# Patient Record
Sex: Male | Born: 1940 | Race: White | Hispanic: No | Marital: Married | Smoking: Never smoker
Health system: Southern US, Community
[De-identification: ages and names within clinical notes are randomized; demographics above are authoritative.]

## PROBLEM LIST (undated history)

## (undated) DIAGNOSIS — E785 Hyperlipidemia, unspecified: Secondary | ICD-10-CM

## (undated) DIAGNOSIS — R0683 Snoring: Secondary | ICD-10-CM

## (undated) DIAGNOSIS — H409 Unspecified glaucoma: Secondary | ICD-10-CM

## (undated) DIAGNOSIS — E669 Obesity, unspecified: Secondary | ICD-10-CM

## (undated) DIAGNOSIS — I10 Essential (primary) hypertension: Secondary | ICD-10-CM

## (undated) DIAGNOSIS — M1990 Unspecified osteoarthritis, unspecified site: Secondary | ICD-10-CM

## (undated) DIAGNOSIS — K635 Polyp of colon: Secondary | ICD-10-CM

## (undated) DIAGNOSIS — Z800 Family history of malignant neoplasm of digestive organs: Secondary | ICD-10-CM

## (undated) DIAGNOSIS — G4730 Sleep apnea, unspecified: Secondary | ICD-10-CM

## (undated) HISTORY — DX: Sleep apnea, unspecified: G47.30

## (undated) HISTORY — PX: EYE SURGERY: SHX253

## (undated) HISTORY — DX: Hyperlipidemia, unspecified: E78.5

## (undated) HISTORY — PX: HX COLONOSCOPY: 2100001147

## (undated) HISTORY — PX: HX EYE SURGERY: 2100001143

---
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
## 1962-01-18 HISTORY — PX: HEMORROIDECTOMY: SUR656

## 2003-01-19 HISTORY — PX: KNEE CARTILAGE SURGERY: SHX688

## 2005-09-22 ENCOUNTER — Ambulatory Visit: Payer: Self-pay

## 2007-01-06 ENCOUNTER — Ambulatory Visit: Payer: Self-pay

## 2009-01-18 HISTORY — PX: HX KNEE SURGERY: 2100001320

## 2009-05-16 ENCOUNTER — Other Ambulatory Visit: Payer: Self-pay

## 2009-05-16 ENCOUNTER — Ambulatory Visit: Admission: RE | Admit: 2009-05-16 | Payer: Self-pay | Source: Ambulatory Visit

## 2009-05-20 LAB — HISTORICAL SURGICAL PATHOLOGY SPECIMEN

## 2011-01-19 HISTORY — PX: HX EYE SURGERY: 2100001143

## 2014-09-26 ENCOUNTER — Other Ambulatory Visit (HOSPITAL_BASED_OUTPATIENT_CLINIC_OR_DEPARTMENT_OTHER): Payer: Self-pay | Admitting: GASTROENTEROLOGY

## 2014-09-26 ENCOUNTER — Encounter (HOSPITAL_BASED_OUTPATIENT_CLINIC_OR_DEPARTMENT_OTHER): Payer: Self-pay

## 2014-09-26 ENCOUNTER — Ambulatory Visit (HOSPITAL_BASED_OUTPATIENT_CLINIC_OR_DEPARTMENT_OTHER)
Admission: RE | Admit: 2014-09-26 | Discharge: 2014-09-26 | Disposition: A | Discharge status: Home / Self Care | Payer: Medicare PPO | Source: Ambulatory Visit | Attending: GASTROENTEROLOGY | Admitting: GASTROENTEROLOGY

## 2014-09-26 DIAGNOSIS — M1990 Unspecified osteoarthritis, unspecified site: Secondary | ICD-10-CM | POA: Insufficient documentation

## 2014-09-26 DIAGNOSIS — E669 Obesity, unspecified: Secondary | ICD-10-CM | POA: Insufficient documentation

## 2014-09-26 DIAGNOSIS — H409 Unspecified glaucoma: Secondary | ICD-10-CM | POA: Insufficient documentation

## 2014-09-26 DIAGNOSIS — I10 Essential (primary) hypertension: Secondary | ICD-10-CM | POA: Insufficient documentation

## 2014-09-26 DIAGNOSIS — Z01818 Encounter for other preprocedural examination: Secondary | ICD-10-CM | POA: Insufficient documentation

## 2014-09-26 DIAGNOSIS — E785 Hyperlipidemia, unspecified: Secondary | ICD-10-CM | POA: Insufficient documentation

## 2014-09-26 DIAGNOSIS — Z800 Family history of malignant neoplasm of digestive organs: Secondary | ICD-10-CM | POA: Insufficient documentation

## 2014-09-26 DIAGNOSIS — K635 Polyp of colon: Secondary | ICD-10-CM | POA: Insufficient documentation

## 2014-09-26 HISTORY — DX: Unspecified osteoarthritis, unspecified site: M19.90

## 2014-09-26 HISTORY — DX: Family history of malignant neoplasm of digestive organs: Z80.0

## 2014-09-26 HISTORY — DX: Unspecified glaucoma: H40.9

## 2014-09-26 HISTORY — DX: Snoring: R06.83

## 2014-09-26 HISTORY — DX: Polyp of colon: K63.5

## 2014-09-26 HISTORY — DX: Hyperlipidemia, unspecified: E78.5

## 2014-09-26 HISTORY — DX: Essential (primary) hypertension: I10

## 2014-09-26 HISTORY — DX: Obesity, unspecified: E66.9

## 2014-09-26 LAB — CBC WITH DIFF
BASOPHIL #: 0.1 x10ˆ3/uL (ref 0.00–0.10)
BASOPHIL %: 1 % (ref 0–3)
EOSINOPHIL #: 0.1 x10ˆ3/uL (ref 0.00–0.50)
EOSINOPHIL %: 2 % (ref 0–5)
HCT: 45.7 % (ref 40.0–50.0)
HGB: 15.3 g/dL (ref 13.5–18.0)
LYMPHOCYTE #: 1.7 x10ˆ3/uL (ref 1.00–4.80)
LYMPHOCYTE %: 27 % (ref 15–43)
MCH: 31.6 pg (ref 27.5–33.2)
MCHC: 33.6 g/dL (ref 32.0–36.0)
MCV: 94.2 fL (ref 82.0–97.0)
MONOCYTE #: 0.6 x10ˆ3/uL (ref 0.20–0.90)
MONOCYTE %: 10 % (ref 5–12)
MPV: 8.2 fL (ref 7.4–10.5)
NEUTROPHIL #: 3.8 x10ˆ3/uL (ref 1.50–6.50)
NEUTROPHIL %: 60 % (ref 43–76)
PLATELETS: 238 x10ˆ3/uL (ref 150–450)
RBC: 4.85 x10ˆ6/uL (ref 4.40–5.80)
RDW: 13.7 % (ref 11.0–16.0)
WBC: 6.3 x10ˆ3/uL (ref 4.0–11.0)

## 2014-09-26 LAB — PT/INR
INR: 0.97
PROTHROMBIN TIME: 10.7 s (ref 9.4–12.5)

## 2014-09-27 LAB — ECG 12-LEAD
Atrial Rate: 62 {beats}/min
Calculated P Axis: 74 degrees
Calculated R Axis: 75 degrees
Calculated T Axis: 72 degrees
PR Interval: 142 ms
QRS Duration: 88 ms
QT Interval: 432 ms
QTC Calculation: 438 ms
Ventricular rate: 62 {beats}/min
Ventricular rate: 62 {beats}/min

## 2014-09-30 ENCOUNTER — Ambulatory Visit (HOSPITAL_BASED_OUTPATIENT_CLINIC_OR_DEPARTMENT_OTHER): Payer: Commercial Managed Care - PPO | Admitting: Anesthesiology

## 2014-09-30 ENCOUNTER — Encounter (HOSPITAL_BASED_OUTPATIENT_CLINIC_OR_DEPARTMENT_OTHER)
Admission: RE | Disposition: A | Discharge status: Home / Self Care | Payer: Self-pay | Source: Ambulatory Visit | Attending: GASTROENTEROLOGY

## 2014-09-30 ENCOUNTER — Inpatient Hospital Stay (HOSPITAL_BASED_OUTPATIENT_CLINIC_OR_DEPARTMENT_OTHER)
Admission: RE | Admit: 2014-09-30 | Discharge: 2014-09-30 | Disposition: A | Discharge status: Home / Self Care | Payer: Medicare PPO | Source: Ambulatory Visit | Attending: GASTROENTEROLOGY | Admitting: GASTROENTEROLOGY

## 2014-09-30 ENCOUNTER — Other Ambulatory Visit (HOSPITAL_COMMUNITY): Payer: Self-pay | Admitting: GASTROENTEROLOGY

## 2014-09-30 ENCOUNTER — Encounter (HOSPITAL_BASED_OUTPATIENT_CLINIC_OR_DEPARTMENT_OTHER): Payer: Self-pay

## 2014-09-30 ENCOUNTER — Ambulatory Visit (HOSPITAL_BASED_OUTPATIENT_CLINIC_OR_DEPARTMENT_OTHER): Payer: Commercial Managed Care - PPO | Admitting: Pain Medicine

## 2014-09-30 DIAGNOSIS — Z6835 Body mass index (BMI) 35.0-35.9, adult: Secondary | ICD-10-CM | POA: Insufficient documentation

## 2014-09-30 DIAGNOSIS — Z86010 Personal history of colonic polyps: Secondary | ICD-10-CM | POA: Insufficient documentation

## 2014-09-30 DIAGNOSIS — K640 First degree hemorrhoids: Secondary | ICD-10-CM | POA: Insufficient documentation

## 2014-09-30 DIAGNOSIS — K621 Rectal polyp: Secondary | ICD-10-CM

## 2014-09-30 DIAGNOSIS — K5730 Diverticulosis of large intestine without perforation or abscess without bleeding: Secondary | ICD-10-CM | POA: Insufficient documentation

## 2014-09-30 DIAGNOSIS — Z800 Family history of malignant neoplasm of digestive organs: Secondary | ICD-10-CM | POA: Insufficient documentation

## 2014-09-30 DIAGNOSIS — E785 Hyperlipidemia, unspecified: Secondary | ICD-10-CM | POA: Insufficient documentation

## 2014-09-30 DIAGNOSIS — Z79899 Other long term (current) drug therapy: Secondary | ICD-10-CM | POA: Insufficient documentation

## 2014-09-30 DIAGNOSIS — D120 Benign neoplasm of cecum: Secondary | ICD-10-CM

## 2014-09-30 DIAGNOSIS — E669 Obesity, unspecified: Secondary | ICD-10-CM | POA: Insufficient documentation

## 2014-09-30 DIAGNOSIS — Z7982 Long term (current) use of aspirin: Secondary | ICD-10-CM | POA: Insufficient documentation

## 2014-09-30 DIAGNOSIS — Z87891 Personal history of nicotine dependence: Secondary | ICD-10-CM | POA: Insufficient documentation

## 2014-09-30 DIAGNOSIS — I10 Essential (primary) hypertension: Secondary | ICD-10-CM | POA: Insufficient documentation

## 2014-09-30 DIAGNOSIS — K635 Polyp of colon: Secondary | ICD-10-CM | POA: Insufficient documentation

## 2014-09-30 SURGERY — COLONOSCOPY WITH BIOPSY
Anesthesia: General | Wound class: Clean Contaminated Wounds-The respiratory, GI, Genital, or urinary

## 2014-09-30 MED ORDER — LACTATED RINGERS INTRAVENOUS SOLUTION
INTRAVENOUS | Status: DC
Start: 2014-09-30 — End: 2014-09-30

## 2014-09-30 MED ORDER — LIDOCAINE (PF) 100 MG/5 ML (2 %) INTRAVENOUS SYRINGE
INJECTION | Freq: Once | INTRAVENOUS | Status: DC | PRN
Start: 2014-09-30 — End: 2014-09-30
  Administered 2014-09-30: 100 mg via INTRAVENOUS

## 2014-09-30 MED ORDER — PROPOFOL 10 MG/ML IV - CHI
INTRAVENOUS | Status: DC | PRN
Start: 2014-09-30 — End: 2014-09-30
  Administered 2014-09-30: 200 ug/kg/min via INTRAVENOUS
  Administered 2014-09-30: 125 ug/kg/min via INTRAVENOUS
  Administered 2014-09-30: 100 ug/kg/min via INTRAVENOUS
  Administered 2014-09-30: 0 ug/kg/min via INTRAVENOUS

## 2014-09-30 MED ADMIN — lactated Ringers intravenous solution: INTRAVENOUS | @ 09:00:00

## 2014-09-30 SURGICAL SUPPLY — 14 items
CONV USE ITEM 308913 - GLOVE SURG 8.5 LF PF BEAD CUF_STRL PROTEXIS PI PLISPRN (GLOVES AND ACCESSORIES) ×1
CONV USE ITEM 321850 - GLOVE SURG 8.5 LF PF BEAD CUF_STRL PROTEXIS PI PLISPRN (GLOVES AND ACCESSORIES) ×1 IMPLANT
FORCEPS BIOPSY MICROMESH TTH STREAMLINE CATH NEEDLE 240CM 2.4MM RJ 4 SS LRG CPC STRL DISP ORNG 2.8MM (SURGICAL INSTRUMENTS) ×1 IMPLANT
FORCEPS BIOPSY NEEDLE 240CM 2._4MM RJ 4 2.8MM LRG CPC STRL (INSTRUMENTS) ×1
GW ENDOSCOPIC .035IN 260CM DREAMWIRE ANG RX EGLD NITINOL BIL STRL DISP (ENDOSCOPIC SUPPLIES) ×1 IMPLANT
INK ENDOSCOPIC MARKER SPOT 5ML GIS44 STERILE 10EA/BX (MISCELLANEOUS PT CARE ITEMS) IMPLANT
NEEDLE SCLRTX 23GA 2.3MM .32MM CNRST SHEATH INNER CATH STRL DISP INTJCT 4MM 240CM (NEEDLES & SYRINGE SUPPLIES) IMPLANT
NEEDLE SCLRTX 23GA 2.3MM .32MM_INNER CATH CNRST SHTH STRL (NEEDLES & SYRINGE SUPPLIES)
SNARE LRG OVL MED STF ENDOS OL_MPS DISP (INSTRUMENTS ENDOMECHANICAL) ×1
SYRINGE LL 30ML LF  STRL CONCEN TIP GRAD N-PYRG DEHP-FR MED DISP (NEEDLES & SYRINGE SUPPLIES) IMPLANT
SYRINGE LL 30ML LF STRL CONCE_N TIP GRAD N-PYRG DEHP-FR MED (NEEDLES & SYRINGE SUPPLIES)
TRAP SPEC RETR 15CM ETRAP PLPC_TM STRL DISP (INSTRUMENTS) ×1
TRAP SPECI REM 15CM ETRAP MAGNIFY WIND MSR GUIDE PLPCTM STRL LF  DISP (SURGICAL INSTRUMENTS) ×1 IMPLANT
USE ITEM 60432 FORCEPS BIOPSY MICROMESH TTH STREAMLINE CATH NEEDLE 240CM 2.4MM RJ 4 SS LRG CPC STRL DISP ORNG 2.8MM (SURGICAL INSTRUMENTS) ×1 IMPLANT

## 2014-09-30 NOTE — Anesthesia Preprocedure Evaluation (Signed)
ANESTHESIA PRE-OP EVALUATION      Airway       Mallampati: III      Neck ROM: full  Mouth Opening: good.            Dental           (+) chipped, missing, poor dentition           Pulmonary    Breath sounds clear to auscultation  (-) no rhonchi, no decreased breath sounds, no wheezes, no rales and no stridor     Cardiovascular    Rhythm: regular  Rate: Normal  (-) no friction rub, carotid bruit is not present, no peripheral edema and no murmur     Other findings          Planned anesthesia type: IV general  ASA 3       Anesthetic plan and risks discussed with patient.             Patient's NPO status is appropriate for Anesthesia.         Plan discussed with CRNA.            Pregnant: N\A  Familial Anesthesia Difficulties: no

## 2014-09-30 NOTE — H&P (Signed)
[facility]                                  [place], [place] [address]                                     [phone]                     SURGICAL HISTORY AND PHYSICAL    PATIENT NAME: [place], Cole Chambers [facility] NTIRWE:[id number]  DATE OF SERVICE:09/30/2014  DATE OF BIRTH: 05-10-1940    HISTORY OF PRESENT ILLNESS:  The patient is a 74 year old gentleman with a history of colon polyps.  She had a serrated adenoma.  His last colonoscopy was in 2011.  He also has a family history of colon cancer.  His weight has been decreasing but he is dieting.  He has a good appetite without nausea, vomiting, pyrosis or dysphagia.  He denies abdominal pain, diarrhea, constipation, melena, or hematochezia.    PAST MEDICAL HISTORY:  Significant for colon polyps, hemorrhoidectomy.    ALLERGIES:  Denies any drug allergies.    FAMILY HISTORY:  Father had colon cancer.    MEDICATIONS:  Lipitor, diltiazem, baby aspirin and naproxen.    PHYSICAL EXAMINATION:  GENERAL:  He is a well-developed gentleman.  VITAL SIGNS:  Blood pressure 126/82, pulse 80 and weight 227 pounds.  HEENT:  Conjunctivae pink, sclerae nonicteric.  No lymphadenopathy.    HEART:  Regular rate and rhythm.   LUNGS:  Clear.    ABDOMEN:  Bowel sounds present, soft and nontender without masses or hepatosplenomegaly.      ASSESSMENT AND PLAN:  The patient is a 74 year old gentleman with a family history of colon cancer and a history of polyps.  He will undergo a colonoscopy for further evaluation.    I thank Dr. Mardella Layman for letting me participate in this patient's care.      Kirstie Peri, MD      [id number]; D: 09/29/2014 09:17:02; T: 09/29/2014 10:19:02    cc: Mardella Layman MD      [id number]      [place] [address]

## 2014-09-30 NOTE — Care Plan (Signed)
Problem: General Plan of Care(Adult,OB)  Goal: Plan of Care Review(Adult,OB)  The patient and/or their representative will communicate an understanding of their plan of care   Outcome: Adequate for Discharge Date Met:  09/30/14  Goal: Individualization/Patient Specific Goal(Adult/OB)  Outcome: Adequate for Discharge Date Met:  09/30/14    Problem: Anxiety (Adult)  Goal: Identify Related Risk Factors and Signs and Symptoms  Related risk factors and signs and symptoms are identified upon initiation of Human Response Clinical Practice Guideline (CPG)   Outcome: Adequate for Discharge Date Met:  09/30/14  Goal: Reduction/Resolution  Patient will demonstrate the desired outcomes by discharge/transition of care.   Outcome: Adequate for Discharge Date Met:  09/30/14    Problem: Fear (Adult)  Goal: Identify Related Risk Factors and Signs and Symptoms  Related risk factors and signs and symptoms are identified upon initiation of Human Response Clinical Practice Guideline (CPG)   Outcome: Adequate for Discharge Date Met:  09/30/14  Goal: Self-Control  Patient will demonstrate the desired outcomes by discharge/transition of care.   Outcome: Adequate for Discharge Date Met:  09/30/14    Problem: Thought Process Alteration (Adult)  Goal: Identify Related Risk Factors and Signs and Symptoms  Related risk factors and signs and symptoms are identified upon initiation of Human Response Clinical Practice Guideline (CPG)   Outcome: Adequate for Discharge Date Met:  09/30/14  Goal: Improved Thought Process  Patient will demonstrate the desired outcomes by discharge/transition of care.   Outcome: Adequate for Discharge Date Met:  09/30/14    Problem: Health Knowledge, Opportunity for Enhanced (Adult,NICU,Newborn,Obstetrics,Pediatric)  Goal: Identify Related Risk Factors and Signs and Symptoms  Related risk factors and signs and symptoms are identified upon initiation of Human Response Clinical Practice Guideline (CPG)   Outcome:  Adequate for Discharge Date Met:  09/30/14  Goal: Knowledgeable about Health Subject/Topic  Patient will demonstrate the desired outcomes by discharge/transition of care.   Outcome: Adequate for Discharge Date Met:  09/30/14

## 2014-09-30 NOTE — Discharge Instructions (Signed)
[facility]  [place], [place] [address]     Anesthesia Discharge Instructions    1.  Do NOT drive an automobile today.    2.  Do NOT sign any legal documents for 24 hours.    3.  Do NOT operate any electrical equipment today.    Fall risk prevention education has been reviewed with the patient/significant other.

## 2014-09-30 NOTE — OR Surgeon (Signed)
[facility]                                  [place], [place] [address]                                     [phone]                           OPERATIVE SUMMARY    PATIENT NAME: PASTOR, SGRO [facility] LSLHTD:[id number]  DATE OF SERVICE:09/30/2014  DATE OF BIRTH: 04-09-1940    NAME OF PROCEDURE:  Colonoscopy to the cecum with polypectomy and biopsies.    PREOPERATIVE DIAGNOSIS:  This is a 74 year old gentleman with a history of polyps.  Last colonoscopy 5 years ago.    POSTOPERATIVE DIAGNOSES:  Colonoscopy to cecum revealing:  1.  A 0.5 cm flat polyp in the base of the cecum, status post cold snare removal.  2.  Normal distal 3 cm of the terminal ileum.  3.  A 0.3 cm flat polyp at 50 cm, status post cold biopsy removal.  4.  Moderately severe left-sided diverticulosis.  5.  Small nodule in the distal rectum, status post cold biopsy removal.  6.  Grade I internal hemorrhoids.    SURGEON:  Kirstie Peri, MD.    DESCRIPTION OF PROCEDURE:  After informed consent was obtained from the patient who was explained the procedure of a colonoscopy and the inherent risks which include possible bleeding, possible infection, possible perforation with the need for surgery, the patient understood these risks and were willing to undergo the procedure.  Under continuous monitoring of vital signs and pulse ox, the patient had adequate IV medication and a digital rectal exam which revealed no masses.  The Olympus video colonoscope was then inserted under direct visualization to the base of the cecum.  The cecum was identified by noting the appendiceal orifice as well as the ileocecal valve.  The findings are the following:  The cecum had a 0.5-0.6 cm flat polyp at the cecal base.  This was removed with a cold snare.  There was some oozing of blood which stopped spontaneously.  The colonoscope was withdrawn to the level of the ileocecal  valve which was traversed.  The distal 3 cm of the terminal ileum appeared grossly within normal limits without any ulcerations or erythema.  The colonoscope was withdrawn to the ascending and transverse colons which had mild diverticulosis.  The descending colon had a 0.3-0.4 cm flat polyp at 50 cm, removed with cold biopsy forceps.  There was moderately severe left-sided diverticulosis.  Sigmoid colon diverticulosis appeared grossly within normal limits.  The rectum had two 0.3 cm flat polyps.  These were removed with cold biopsy forceps.  On retroflexion, grade I internal hemorrhoids were noted.  The colonoscope was removed.  The patient tolerated the procedure well.    PLAN:  Await pathology.  The patient will probably have a repeat colonoscopy in 3-5 years.    I thank Dr. Orland Dec for letting me participate in this patient's care.      Kirstie Peri, MD  [id number]; D: 09/30/2014 10:31:48; T: 09/30/2014 11:15:42    cc: Neldon Labella MD      [address] [place] [address]

## 2014-09-30 NOTE — Anesthesia Transfer of Care (Signed)
ANESTHESIA TRANSFER OF CARE NOTE                Last Vitals: Temperature: 36.5 C (97.7 F) (09/30/14 0847)  Heart Rate: 84 (09/30/14 1031)  BP (Non-Invasive): 100/78 mmHg (09/30/14 1031)  Respiratory Rate: 18 (09/30/14 1031)  SpO2-1: 100 % (09/30/14 1031)    Patient transferred to ods in stable condition. Report given to RN.    9/12/2016at 10:32.

## 2014-09-30 NOTE — Nurses Notes (Signed)
Return from or, awake, turned to left side, expelling flatus, abd soft, bowel sounds present, denies pain #0, tol po fluids, wife at bedside, dr Luiz Ochoa here. Understands written dc instructions, dc'd in sats cond amb with wife

## 2014-09-30 NOTE — Anesthesia Postprocedure Evaluation (Signed)
ANESTHESIA POSTOP EVALUATION NOTE         09/30/2014     Last Vitals: Temperature: 36.5 C (97.7 F) (09/30/14 0847)  Heart Rate: 84 (09/30/14 1031)  BP (Non-Invasive): 100/78 mmHg (09/30/14 1031)  Respiratory Rate: 18 (09/30/14 1031)  SpO2-1: 100 % (09/30/14 1031)    Procedures:   COLONOSCOPY WITH BIOPSY (N/A )  COLONOSCOPY WITH POLYPECTOMY (N/A )    Patient is sufficiently recovered from the effects of anesthesia to participate in the evaluation and has returned to their pre-procedure level.  I have reviewed and evaluated the following:  Respiratory Function: Consistent with pre anesthetic level  Cardiovascular Function: Consistent with pre anesthetic level  Mental Status: Return to pre anesthetic baseline level  Pain: Sufficiently controlled with medication  Nausea and Vomiting: Absent or sufficiently controlled with medication  Post-op Anesthetic Complications: None    Comment/ re-evaluation for any variations: None

## 2014-09-30 NOTE — Care Plan (Signed)
Problem: Perioperative Period (Adult)  Prevent and manage potential problems including:1. bleeding2. gastrointestinal complications3. hypothermia4. infection5. pain6. perioperative injury7. respiratory compromise8. situational response9. urinary retention10. venous thromboembolism11. wound complications   Goal: Signs and Symptoms of Listed Potential Problems Will be Absent or Manageable (Perioperative Period)  Signs and symptoms of listed potential problems will be absent or manageable by discharge/transition of care (reference Perioperative Period (Adult) CPG).   Outcome: Adequate for Discharge Date Met:  09/30/14

## 2014-09-30 NOTE — Brief Op Note (Signed)
[place]                                               BRIEF OPERATIVE NOTE - Colonoscopy    Patient Name: [facility] Number: [id number]  Encounter number: [id number]  Date of Service: 09/30/2014   Date of Birth: 01/13/1941    Last colonoscopy:  polyp 5 years ago    Pre-Operative Diagnosis: FAMILY HX OF COLON CANCER     Post-Operative Diagnosis: POLYPS, DIVERTICULOSIS, HEMORRHOIDS    Procedure(s)/Description:  Procedure(s):  COLONOSCOPY WITH BIOPSY  COLONOSCOPY WITH POLYPECTOMY    Attending Surgeon: Marlana Salvage, MD    Findings: polyp cecum,50,rectum     Specimens:  See Nurse note    Estimated Blood Loss:  Minimal    Complications:  None           Disposition: Phase II           Condition: stable    Colonoscopy follow up:  Recommend follow up in 3-5 years unless pathology report indicates otherwise    Patient is at increased risk for surgical bleeding:  No    Patient is on postop antibiotic regimen greater than 24 hours:  No

## 2014-10-01 LAB — HISTORICAL SURGICAL PATHOLOGY SPECIMEN

## 2018-07-25 ENCOUNTER — Ambulatory Visit: Payer: Medicare Other | Admitting: Podiatry

## 2018-07-25 ENCOUNTER — Encounter: Payer: Self-pay | Admitting: Podiatry

## 2018-07-25 ENCOUNTER — Other Ambulatory Visit: Payer: Self-pay

## 2018-07-25 DIAGNOSIS — B351 Tinea unguium: Secondary | ICD-10-CM | POA: Insufficient documentation

## 2018-07-25 DIAGNOSIS — M79674 Pain in right toe(s): Secondary | ICD-10-CM | POA: Diagnosis not present

## 2018-07-25 DIAGNOSIS — M79675 Pain in left toe(s): Secondary | ICD-10-CM | POA: Diagnosis not present

## 2018-07-25 NOTE — Progress Notes (Signed)
Complaint:  Visit Type: Patient presents  to my office for preventative foot care services. Complaint: Patient states" my nails have grown long and thick and become painful to walk and wear shoes"  Patient says he has moved here from [place].  He is scheduled for back surgery in the future. Patient has been diagnosed with DM with no foot complications. The patient presents for preventative foot care services. No changes to ROS  Podiatric Exam: Vascular: dorsalis pedis and posterior tibial pulses are palpable bilateral. Capillary return is immediate. Temperature gradient is WNL. Skin turgor WNL  Sensorium: Normal Semmes Weinstein monofilament test. Normal tactile sensation bilaterally. Nail Exam: Pt has thick disfigured discolored nails with subungual debris noted bilateral entire nail hallux through fifth toenails Ulcer Exam: There is no evidence of ulcer or pre-ulcerative changes or infection. Orthopedic Exam: Muscle tone and strength are WNL. No limitations in general ROM. No crepitus or effusions noted. Foot type and digits show no abnormalities. Bony prominences are unremarkable. Skin: No Porokeratosis. No infection or ulcers  Diagnosis:  Onychomycosis, , Pain in right toe, pain in left toes  Treatment & Plan Procedures and Treatment: Consent by patient was obtained for treatment procedures.   Debridement of mycotic and hypertrophic toenails, 1 through 5 bilateral and clearing of subungual debris. No ulceration, no infection noted.  Return Visit-Office Procedure: Patient instructed to return to the office for a follow up visit 3 months for continued evaluation and treatment.    Gardiner Barefoot DPM

## 2018-08-23 ENCOUNTER — Other Ambulatory Visit: Payer: Self-pay | Admitting: Orthopedic Surgery

## 2018-09-05 ENCOUNTER — Other Ambulatory Visit: Payer: Self-pay

## 2018-09-05 ENCOUNTER — Encounter (HOSPITAL_COMMUNITY): Payer: Self-pay

## 2018-09-05 ENCOUNTER — Encounter (HOSPITAL_COMMUNITY)
Admission: RE | Admit: 2018-09-05 | Discharge: 2018-09-05 | Disposition: A | Discharge status: Home / Self Care | Payer: Medicare Other | Source: Ambulatory Visit | Attending: Orthopedic Surgery | Admitting: Orthopedic Surgery

## 2018-09-05 DIAGNOSIS — M79605 Pain in left leg: Secondary | ICD-10-CM | POA: Diagnosis not present

## 2018-09-05 DIAGNOSIS — Z01818 Encounter for other preprocedural examination: Secondary | ICD-10-CM | POA: Diagnosis present

## 2018-09-05 HISTORY — DX: Essential (primary) hypertension: I10

## 2018-09-05 HISTORY — DX: Unspecified osteoarthritis, unspecified site: M19.90

## 2018-09-05 LAB — CBC WITH DIFFERENTIAL/PLATELET
Abs Immature Granulocytes: 0.03 10*3/uL (ref 0.00–0.07)
Basophils Absolute: 0.1 10*3/uL (ref 0.0–0.1)
Basophils Relative: 1 %
Eosinophils Absolute: 0 10*3/uL (ref 0.0–0.5)
Eosinophils Relative: 0 %
HCT: 48 % (ref 39.0–52.0)
Hemoglobin: 15.7 g/dL (ref 13.0–17.0)
Immature Granulocytes: 0 %
Lymphocytes Relative: 22 %
Lymphs Abs: 1.7 10*3/uL (ref 0.7–4.0)
MCH: 32.5 pg (ref 26.0–34.0)
MCHC: 32.7 g/dL (ref 30.0–36.0)
MCV: 99.4 fL (ref 80.0–100.0)
Monocytes Absolute: 0.7 10*3/uL (ref 0.1–1.0)
Monocytes Relative: 9 %
Neutro Abs: 5.2 10*3/uL (ref 1.7–7.7)
Neutrophils Relative %: 68 %
Platelets: 224 10*3/uL (ref 150–400)
RBC: 4.83 MIL/uL (ref 4.22–5.81)
RDW: 13.2 % (ref 11.5–15.5)
WBC: 7.7 10*3/uL (ref 4.0–10.5)
nRBC: 0 % (ref 0.0–0.2)

## 2018-09-05 LAB — COMPREHENSIVE METABOLIC PANEL
ALT: 56 U/L — ABNORMAL HIGH (ref 0–44)
AST: 30 U/L (ref 15–41)
Albumin: 4.4 g/dL (ref 3.5–5.0)
Alkaline Phosphatase: 35 U/L — ABNORMAL LOW (ref 38–126)
Anion gap: 9 (ref 5–15)
BUN: 13 mg/dL (ref 8–23)
CO2: 28 mmol/L (ref 22–32)
Calcium: 9.5 mg/dL (ref 8.9–10.3)
Chloride: 101 mmol/L (ref 98–111)
Creatinine, Ser: 0.93 mg/dL (ref 0.61–1.24)
GFR calc Af Amer: >60 mL/min (ref >60)
GFR calc non Af Amer: >60 mL/min (ref >60)
Glucose, Bld: 119 mg/dL — ABNORMAL HIGH (ref 70–99)
Potassium: 3.9 mmol/L (ref 3.5–5.1)
Sodium: 138 mmol/L (ref 135–145)
Total Bilirubin: 0.4 mg/dL (ref 0.3–1.2)
Total Protein: 7.3 g/dL (ref 6.5–8.1)

## 2018-09-05 LAB — APTT: aPTT: 27 seconds (ref 24–36)

## 2018-09-05 LAB — URINALYSIS, ROUTINE W REFLEX MICROSCOPIC
Bilirubin Urine: NEGATIVE
Glucose, UA: NEGATIVE mg/dL
Hgb urine dipstick: NEGATIVE
Ketones, ur: NEGATIVE mg/dL
Leukocytes,Ua: NEGATIVE
Nitrite: NEGATIVE
Protein, ur: NEGATIVE mg/dL
Specific Gravity, Urine: 1.018 (ref 1.005–1.030)
pH: 7 (ref 5.0–8.0)

## 2018-09-05 LAB — PROTIME-INR
INR: 1 (ref 0.8–1.2)
Prothrombin Time: 12.6 seconds (ref 11.4–15.2)

## 2018-09-05 LAB — TYPE AND SCREEN
ABO/RH(D): O NEG
Antibody Screen: NEGATIVE

## 2018-09-05 LAB — SURGICAL PCR SCREEN
MRSA, PCR: NEGATIVE
Staphylococcus aureus: NEGATIVE

## 2018-09-05 LAB — ABO/RH: ABO/RH(D): O NEG

## 2018-09-05 NOTE — Progress Notes (Addendum)
PCP - K. Richter,MD, [facility] Cardiologist - none  Chest x-ray - n/a EKG - today- 09/05/2018 Stress Test - none ECHO - none Cardiac Cath - none  Sleep Study - none CPAP - n/a  Fasting Blood Sugar - n/a Checks Blood Sugar _____ times a day  Blood Thinner Instructions:n/a Aspirin Instructions:on hold since 08/31/2018  Anesthesia review: n/a  Patient denies shortness of breath, fever, cough and chest pain at PAT appointment   Patient verbalized understanding of instructions that were given to them at the PAT appointment. Patient was also instructed that they will need to review over the PAT instructions again at home before surgery.  Pt. Moved here from [place], reports that his health has always been good & offers no reports when asked about any cardiac or respiratory events of concern.

## 2018-09-05 NOTE — Pre-Procedure Instructions (Signed)
Gomer France  09/05/2018     Your procedure is scheduled on Thursday, September 14, 2018 at 12:09 PM.   Report to [facility] Entrance "A" Admitting Office at 9:00 AM.   Call this number if you have problems the morning of surgery: [phone]   Questions prior to day of surgery, please call [phone] between 8 & 4 PM.   Remember:  Do not eat food after midnight Wednesday, 09/13/27.  You may drink clear liquids until 9:00 AM .  Clear liquids allowed are: Water, Juice (non-citric and without pulp), Carbonated beverages, Clear Tea, Black Coffee only, Plain Jell-O only, Gatorade and Plain Popsicles only    Take these medicines the morning of surgery with A SIP OF WATER: Diltiazem (Cardizem), Rosuvastatin (Crestor), Hydrocodone - if needed.  Stop Aspirin as instructed by surgeon/physician. Do not use NSAIDS (Ibuprofen, Aleve, etc), Aspirin products (Goody's, BC Powders, etc), Herbal medications or Multivitamins 7 days prior to surgery.    Do not wear jewelry.  Do not wear lotions, powders, cologne or deodorant.  Men may shave face and neck.  Do not bring valuables to the hospital.  [facility] is not responsible for any belongings or valuables.  Contacts, dentures or bridgework may not be worn into surgery.  Leave your suitcase in the car.  After surgery it may be brought to your room.  For patients admitted to the hospital, discharge time will be determined by your treatment team.  Patients discharged the day of surgery will not be allowed to drive home.   [facility] - Preparing for Surgery  Before surgery, you can play an important role.  Because skin is not sterile, your skin needs to be as free of germs as possible.  You can reduce the number of germs on you skin by washing with CHG (chlorahexidine gluconate) soap before surgery.  CHG is an antiseptic cleaner which kills germs and bonds with the skin to continue killing germs even after washing.  Oral Hygiene is also  important in reducing the risk of infection.  Remember to brush your teeth with your regular toothpaste the morning of surgery.  Please DO NOT use if you have an allergy to CHG or antibacterial soaps.  If your skin becomes reddened/irritated stop using the CHG and inform your nurse when you arrive at Short Stay.  Do not shave (including legs and underarms) for at least 48 hours prior to the first CHG shower.  You may shave your face.  Please follow these instructions carefully:   1.  Shower with CHG Soap the night before surgery and the morning of Surgery.  2.  If you choose to wash your hair, wash your hair first as usual with your normal shampoo.  3.  After you shampoo, rinse your hair and body thoroughly to remove the shampoo. 4.  Use CHG as you would any other liquid soap.  You can apply chg directly to the skin and wash gently with a      scrungie or washcloth.           5.  Apply the CHG Soap to your body ONLY FROM THE NECK DOWN.   Do not use on open wounds or open sores. Avoid contact with your eyes, ears, mouth and genitals (private parts).  Wash genitals (private parts) with your normal soap - do this prior to using CHG soap.  6.  Wash thoroughly, paying special attention to the area where your surgery will be performed.  7.  Thoroughly rinse your body with warm water from the neck down.  8.  DO NOT shower/wash with your normal soap after using and rinsing off the CHG Soap.  9.  Pat yourself dry with a clean towel.            10.  Wear clean pajamas.            11.  Place clean sheets on your bed the night of your first shower and do not sleep with pets.  Day of Surgery  Shower as above. Do not apply any lotions/deodorants the morning of surgery.   Please wear clean clothes to the hospital. Remember to brush your teeth with toothpaste.   Please read over the fact sheets that you were given.

## 2018-09-11 ENCOUNTER — Other Ambulatory Visit (HOSPITAL_COMMUNITY): Payer: Self-pay

## 2018-09-11 DIAGNOSIS — Z01818 Encounter for other preprocedural examination: Secondary | ICD-10-CM | POA: Diagnosis not present

## 2018-09-11 LAB — SARS CORONAVIRUS 2 (TAT 6-24 HRS): SARS Coronavirus 2: NEGATIVE

## 2018-09-14 ENCOUNTER — Encounter (HOSPITAL_COMMUNITY): Payer: Self-pay

## 2018-09-14 ENCOUNTER — Ambulatory Visit (HOSPITAL_COMMUNITY): Payer: Medicare Other | Admitting: Certified Registered Nurse Anesthetist

## 2018-09-14 ENCOUNTER — Ambulatory Visit (HOSPITAL_COMMUNITY): Payer: Medicare Other

## 2018-09-14 ENCOUNTER — Encounter (HOSPITAL_COMMUNITY)
Admission: RE | Disposition: A | Discharge status: Home / Self Care | Payer: Self-pay | Source: Home / Self Care | Attending: Orthopedic Surgery

## 2018-09-14 ENCOUNTER — Other Ambulatory Visit: Payer: Self-pay

## 2018-09-14 ENCOUNTER — Ambulatory Visit (HOSPITAL_COMMUNITY)
Admission: RE | Admit: 2018-09-14 | Discharge: 2018-09-14 | Disposition: A | Discharge status: Home / Self Care | Payer: Medicare Other | Attending: Orthopedic Surgery | Admitting: Orthopedic Surgery

## 2018-09-14 DIAGNOSIS — M48061 Spinal stenosis, lumbar region without neurogenic claudication: Secondary | ICD-10-CM | POA: Insufficient documentation

## 2018-09-14 DIAGNOSIS — M5416 Radiculopathy, lumbar region: Secondary | ICD-10-CM | POA: Insufficient documentation

## 2018-09-14 DIAGNOSIS — Z9842 Cataract extraction status, left eye: Secondary | ICD-10-CM | POA: Insufficient documentation

## 2018-09-14 DIAGNOSIS — F17290 Nicotine dependence, other tobacco product, uncomplicated: Secondary | ICD-10-CM | POA: Insufficient documentation

## 2018-09-14 DIAGNOSIS — Z9841 Cataract extraction status, right eye: Secondary | ICD-10-CM | POA: Insufficient documentation

## 2018-09-14 DIAGNOSIS — Z20828 Contact with and (suspected) exposure to other viral communicable diseases: Secondary | ICD-10-CM | POA: Insufficient documentation

## 2018-09-14 DIAGNOSIS — Z961 Presence of intraocular lens: Secondary | ICD-10-CM | POA: Insufficient documentation

## 2018-09-14 DIAGNOSIS — I10 Essential (primary) hypertension: Secondary | ICD-10-CM | POA: Insufficient documentation

## 2018-09-14 DIAGNOSIS — M4696 Unspecified inflammatory spondylopathy, lumbar region: Secondary | ICD-10-CM | POA: Insufficient documentation

## 2018-09-14 DIAGNOSIS — Z419 Encounter for procedure for purposes other than remedying health state, unspecified: Secondary | ICD-10-CM

## 2018-09-14 HISTORY — PX: LUMBAR LAMINECTOMY/DECOMPRESSION MICRODISCECTOMY: SHX5026

## 2018-09-14 SURGERY — LUMBAR LAMINECTOMY/DECOMPRESSION MICRODISCECTOMY
Anesthesia: General

## 2018-09-14 MED ORDER — FENTANYL CITRATE (PF) 100 MCG/2ML IJ SOLN: 25.0000 ug | INTRAMUSCULAR | Status: DC | PRN

## 2018-09-14 MED ORDER — ONDANSETRON HCL 4 MG/2ML IJ SOLN
INTRAMUSCULAR | Status: AC
  Filled 2018-09-14: qty 2

## 2018-09-14 MED ORDER — BUPIVACAINE-EPINEPHRINE 0.25% -1:200000 IJ SOLN
INTRAMUSCULAR | Status: DC | PRN
  Administered 2018-09-14: 7 mL

## 2018-09-14 MED ORDER — BUPIVACAINE LIPOSOME 1.3 % IJ SUSP
INTRAMUSCULAR | Status: DC | PRN
  Administered 2018-09-14: 20 mL

## 2018-09-14 MED ORDER — FENTANYL CITRATE (PF) 250 MCG/5ML IJ SOLN
INTRAMUSCULAR | Status: AC
  Filled 2018-09-14: qty 5

## 2018-09-14 MED ORDER — BUPIVACAINE LIPOSOME 1.3 % IJ SUSP
20.0000 mL | Freq: Once | INTRAMUSCULAR | Status: DC
  Filled 2018-09-14: qty 20

## 2018-09-14 MED ORDER — THROMBIN (RECOMBINANT) 20000 UNITS EX SOLR
CUTANEOUS | Status: AC
  Filled 2018-09-14: qty 20000

## 2018-09-14 MED ORDER — 0.9 % SODIUM CHLORIDE (POUR BTL) OPTIME
TOPICAL | Status: DC | PRN
  Administered 2018-09-14 (×2): 1000 mL

## 2018-09-14 MED ORDER — OXYCODONE-ACETAMINOPHEN 5-325 MG PO TABS: 1.0000 | ORAL_TABLET | ORAL | 0 refills | Status: AC | PRN

## 2018-09-14 MED ORDER — METHYLENE BLUE 0.5 % INJ SOLN
INTRAVENOUS | Status: AC
  Filled 2018-09-14: qty 10

## 2018-09-14 MED ORDER — LACTATED RINGERS IV SOLN
INTRAVENOUS | Status: DC
  Administered 2018-09-14 (×3): via INTRAVENOUS

## 2018-09-14 MED ORDER — POVIDONE-IODINE 7.5 % EX SOLN: Freq: Once | CUTANEOUS | Status: DC

## 2018-09-14 MED ORDER — ROCURONIUM BROMIDE 10 MG/ML (PF) SYRINGE
PREFILLED_SYRINGE | INTRAVENOUS | Status: DC | PRN
  Administered 2018-09-14: 10 mg via INTRAVENOUS
  Administered 2018-09-14: 20 mg via INTRAVENOUS
  Administered 2018-09-14: 50 mg via INTRAVENOUS
  Administered 2018-09-14: 10 mg via INTRAVENOUS
  Administered 2018-09-14: 20 mg via INTRAVENOUS

## 2018-09-14 MED ORDER — DIAZEPAM 5 MG PO TABS: 5.0000 mg | ORAL_TABLET | Freq: Three times a day (TID) | ORAL | 0 refills | Status: AC | PRN

## 2018-09-14 MED ORDER — ONDANSETRON HCL 4 MG/2ML IJ SOLN: 4.0000 mg | Freq: Once | INTRAMUSCULAR | Status: DC | PRN

## 2018-09-14 MED ORDER — DEXAMETHASONE SODIUM PHOSPHATE 10 MG/ML IJ SOLN
INTRAMUSCULAR | Status: AC
  Filled 2018-09-14: qty 1

## 2018-09-14 MED ORDER — EPHEDRINE SULFATE-NACL 50-0.9 MG/10ML-% IV SOSY
PREFILLED_SYRINGE | INTRAVENOUS | Status: DC | PRN
  Administered 2018-09-14: 5 mg via INTRAVENOUS
  Administered 2018-09-14: 10 mg via INTRAVENOUS
  Administered 2018-09-14 (×2): 5 mg via INTRAVENOUS

## 2018-09-14 MED ORDER — LIDOCAINE 2% (20 MG/ML) 5 ML SYRINGE
INTRAMUSCULAR | Status: AC
  Filled 2018-09-14: qty 5

## 2018-09-14 MED ORDER — METHYLPREDNISOLONE ACETATE 80 MG/ML IJ SUSP
INTRAMUSCULAR | Status: AC
  Filled 2018-09-14: qty 1

## 2018-09-14 MED ORDER — PHENYLEPHRINE 40 MCG/ML (10ML) SYRINGE FOR IV PUSH (FOR BLOOD PRESSURE SUPPORT)
PREFILLED_SYRINGE | INTRAVENOUS | Status: AC
  Filled 2018-09-14: qty 10

## 2018-09-14 MED ORDER — ROCURONIUM BROMIDE 10 MG/ML (PF) SYRINGE
PREFILLED_SYRINGE | INTRAVENOUS | Status: AC
  Filled 2018-09-14: qty 10

## 2018-09-14 MED ORDER — LIDOCAINE 20MG/ML (2%) 15 ML SYRINGE OPTIME
INTRAMUSCULAR | Status: DC | PRN
  Administered 2018-09-14: 100 mg via INTRAVENOUS

## 2018-09-14 MED ORDER — SODIUM CHLORIDE 0.9 % IV SOLN: INTRAVENOUS | Status: DC | PRN

## 2018-09-14 MED ORDER — SODIUM CHLORIDE 0.9 % IV SOLN
INTRAVENOUS | Status: DC | PRN
  Administered 2018-09-14: 25 ug/min via INTRAVENOUS

## 2018-09-14 MED ORDER — FENTANYL CITRATE (PF) 100 MCG/2ML IJ SOLN
INTRAMUSCULAR | Status: DC | PRN
  Administered 2018-09-14: 100 ug via INTRAVENOUS
  Administered 2018-09-14 (×5): 50 ug via INTRAVENOUS

## 2018-09-14 MED ORDER — PROPOFOL 10 MG/ML IV BOLUS
INTRAVENOUS | Status: DC | PRN
  Administered 2018-09-14: 150 mg via INTRAVENOUS

## 2018-09-14 MED ORDER — BUPIVACAINE-EPINEPHRINE (PF) 0.25% -1:200000 IJ SOLN
INTRAMUSCULAR | Status: AC
  Filled 2018-09-14: qty 30

## 2018-09-14 MED ORDER — METHYLENE BLUE 0.5 % INJ SOLN
INTRAVENOUS | Status: DC | PRN
  Administered 2018-09-14: .5 mL via SUBMUCOSAL

## 2018-09-14 MED ORDER — OXYCODONE-ACETAMINOPHEN 5-325 MG PO TABS
ORAL_TABLET | ORAL | Status: AC
  Filled 2018-09-14: qty 1

## 2018-09-14 MED ORDER — EPHEDRINE 5 MG/ML INJ
INTRAVENOUS | Status: AC
  Filled 2018-09-14: qty 10

## 2018-09-14 MED ORDER — METHYLPREDNISOLONE ACETATE 80 MG/ML IJ SUSP
INTRAMUSCULAR | Status: DC | PRN
  Administered 2018-09-14: 80 mg

## 2018-09-14 MED ORDER — ONDANSETRON HCL 4 MG/2ML IJ SOLN
INTRAMUSCULAR | Status: DC | PRN
  Administered 2018-09-14: 4 mg via INTRAVENOUS

## 2018-09-14 MED ORDER — SUGAMMADEX SODIUM 200 MG/2ML IV SOLN
INTRAVENOUS | Status: DC | PRN
  Administered 2018-09-14: 200 mg via INTRAVENOUS

## 2018-09-14 MED ORDER — DEXAMETHASONE SODIUM PHOSPHATE 10 MG/ML IJ SOLN
INTRAMUSCULAR | Status: DC | PRN
  Administered 2018-09-14: 10 mg via INTRAVENOUS

## 2018-09-14 MED ORDER — THROMBIN 20000 UNITS EX SOLR
CUTANEOUS | Status: DC | PRN
  Administered 2018-09-14: 20 mL via TOPICAL

## 2018-09-14 MED ORDER — OXYCODONE-ACETAMINOPHEN 5-325 MG PO TABS
1.0000 | ORAL_TABLET | Freq: Once | ORAL | Status: AC
  Administered 2018-09-14: 19:00:00 1 via ORAL

## 2018-09-14 MED ORDER — CEFAZOLIN SODIUM-DEXTROSE 2-4 GM/100ML-% IV SOLN
2.0000 g | INTRAVENOUS | Status: AC
  Administered 2018-09-14: 2 g via INTRAVENOUS
  Filled 2018-09-14: qty 100

## 2018-09-14 SURGICAL SUPPLY — 74 items
BENZOIN TINCTURE PRP APPL 2/3 (GAUZE/BANDAGES/DRESSINGS) ×2 IMPLANT
BUR PRECISION FLUTE 5.0 (BURR) ×3 IMPLANT
CABLE BIPOLOR RESECTION CORD (MISCELLANEOUS) ×3 IMPLANT
CANISTER SUCT 3000ML PPV (MISCELLANEOUS) ×3 IMPLANT
CARTRIDGE OIL MAESTRO DRILL (MISCELLANEOUS) ×1 IMPLANT
CLOSURE WOUND 1/2 X4 (GAUZE/BANDAGES/DRESSINGS) ×2
COVER SURGICAL LIGHT HANDLE (MISCELLANEOUS) ×3 IMPLANT
COVER WAND RF STERILE (DRAPES) ×3 IMPLANT
DIFFUSER DRILL AIR PNEUMATIC (MISCELLANEOUS) ×3 IMPLANT
DRAIN CHANNEL 15F RND FF W/TCR (WOUND CARE) IMPLANT
DRAPE POUCH INSTRU U-SHP 10X18 (DRAPES) ×6 IMPLANT
DRAPE SURG 17X23 STRL (DRAPES) ×12 IMPLANT
DURAPREP 26ML APPLICATOR (WOUND CARE) ×3 IMPLANT
ELECT BLADE 4.0 EZ CLEAN MEGAD (MISCELLANEOUS) ×3
ELECT CAUTERY BLADE 6.4 (BLADE) ×3 IMPLANT
ELECT REM PT RETURN 9FT ADLT (ELECTROSURGICAL) ×3
ELECTRODE BLDE 4.0 EZ CLN MEGD (MISCELLANEOUS) ×1 IMPLANT
ELECTRODE REM PT RTRN 9FT ADLT (ELECTROSURGICAL) ×1 IMPLANT
EVACUATOR SILICONE 100CC (DRAIN) IMPLANT
FILTER STRAW FLUID ASPIR (MISCELLANEOUS) ×3 IMPLANT
GAUZE 4X4 16PLY RFD (DISPOSABLE) ×6 IMPLANT
GAUZE SPONGE 4X4 12PLY STRL (GAUZE/BANDAGES/DRESSINGS) ×3 IMPLANT
GLOVE BIO SURGEON STRL SZ7 (GLOVE) ×5 IMPLANT
GLOVE BIO SURGEON STRL SZ7.5 (GLOVE) ×2 IMPLANT
GLOVE BIO SURGEON STRL SZ8 (GLOVE) ×3 IMPLANT
GLOVE BIOGEL PI IND STRL 7.0 (GLOVE) ×1 IMPLANT
GLOVE BIOGEL PI IND STRL 8 (GLOVE) ×1 IMPLANT
GLOVE BIOGEL PI INDICATOR 7.0 (GLOVE) ×4
GLOVE BIOGEL PI INDICATOR 8 (GLOVE) ×2
GOWN STRL REUS W/ TWL LRG LVL3 (GOWN DISPOSABLE) ×1 IMPLANT
GOWN STRL REUS W/ TWL XL LVL3 (GOWN DISPOSABLE) ×2 IMPLANT
GOWN STRL REUS W/TWL LRG LVL3 (GOWN DISPOSABLE) ×4
GOWN STRL REUS W/TWL XL LVL3 (GOWN DISPOSABLE) ×4
IV CATH 14GX2 1/4 (CATHETERS) ×3 IMPLANT
KIT BASIN OR (CUSTOM PROCEDURE TRAY) ×3 IMPLANT
KIT POSITION SURG JACKSON T1 (MISCELLANEOUS) ×3 IMPLANT
KIT TURNOVER KIT B (KITS) ×3 IMPLANT
NDL 18GX1X1/2 (RX/OR ONLY) (NEEDLE) ×1 IMPLANT
NDL HYPO 25GX1X1/2 BEV (NEEDLE) ×1 IMPLANT
NDL SPNL 18GX3.5 QUINCKE PK (NEEDLE) ×2 IMPLANT
NEEDLE 18GX1X1/2 (RX/OR ONLY) (NEEDLE) ×3 IMPLANT
NEEDLE 22X1 1/2 (OR ONLY) (NEEDLE) ×3 IMPLANT
NEEDLE HYPO 25GX1X1/2 BEV (NEEDLE) ×3 IMPLANT
NEEDLE SPNL 18GX3.5 QUINCKE PK (NEEDLE) ×6 IMPLANT
NS IRRIG 1000ML POUR BTL (IV SOLUTION) ×5 IMPLANT
OIL CARTRIDGE MAESTRO DRILL (MISCELLANEOUS) ×3
PACK LAMINECTOMY ORTHO (CUSTOM PROCEDURE TRAY) ×3 IMPLANT
PACK UNIVERSAL I (CUSTOM PROCEDURE TRAY) ×3 IMPLANT
PAD ARMBOARD 7.5X6 YLW CONV (MISCELLANEOUS) ×6 IMPLANT
PATTIES SURGICAL .5 X.5 (GAUZE/BANDAGES/DRESSINGS) IMPLANT
PATTIES SURGICAL .5 X1 (DISPOSABLE) ×1 IMPLANT
SPONGE INTESTINAL PEANUT (DISPOSABLE) ×3 IMPLANT
SPONGE SURGIFOAM ABS GEL 100 (HEMOSTASIS) ×3 IMPLANT
SPONGE SURGIFOAM ABS GEL SZ50 (HEMOSTASIS) ×3 IMPLANT
STRIP CLOSURE SKIN 1/2X4 (GAUZE/BANDAGES/DRESSINGS) ×2 IMPLANT
SURGIFLO W/THROMBIN 8M KIT (HEMOSTASIS) IMPLANT
SUT MNCRL AB 4-0 PS2 18 (SUTURE) ×3 IMPLANT
SUT VIC AB 0 CT1 18XCR BRD 8 (SUTURE) IMPLANT
SUT VIC AB 0 CT1 27 (SUTURE)
SUT VIC AB 0 CT1 27XBRD ANBCTR (SUTURE) IMPLANT
SUT VIC AB 0 CT1 8-18 (SUTURE)
SUT VIC AB 1 CT1 18XCR BRD 8 (SUTURE) ×1 IMPLANT
SUT VIC AB 1 CT1 8-18 (SUTURE) ×2
SUT VIC AB 2-0 CT2 18 VCP726D (SUTURE) ×5 IMPLANT
SYR 20ML LL LF (SYRINGE) ×3 IMPLANT
SYR BULB IRRIGATION 50ML (SYRINGE) ×3 IMPLANT
SYR CONTROL 10ML LL (SYRINGE) ×6 IMPLANT
SYR TB 1ML 25GX5/8 (SYRINGE) ×6 IMPLANT
SYR TB 1ML LUER SLIP (SYRINGE) ×6 IMPLANT
TAPE CLOTH SURG 4X10 WHT LF (GAUZE/BANDAGES/DRESSINGS) ×2 IMPLANT
TOWEL GREEN STERILE (TOWEL DISPOSABLE) ×3 IMPLANT
TOWEL GREEN STERILE FF (TOWEL DISPOSABLE) ×3 IMPLANT
WATER STERILE IRR 1000ML POUR (IV SOLUTION) ×3 IMPLANT
YANKAUER SUCT BULB TIP NO VENT (SUCTIONS) ×3 IMPLANT

## 2018-09-14 NOTE — Op Note (Signed)
PATIENT NAME: Edward BaileyBasil Hines   MEDICAL RECORD NO.:   [id number]    PHYSICIAN:  Estill BambergMark Kristelle Cavallaro, MD      DATE OF BIRTH: 11/10/1940   DATE OF PROCEDURE: 09/14/2018                               OPERATIVE REPORT   PREOPERATIVE DIAGNOSES: 1. Left leg pain. 2. Spinal stenosis, L3/4, L4/5.  POSTOPERATIVE DIAGNOSES: 1. Left leg pain. 2. Spinal stenosis, L3/4, L4/5.  PROCEDURE:  L3/4, L4/5 laminectomy with bilateral partial facetectomy and bilateral foraminotomy.  SURGEON:  Estill BambergMark Marialice Newkirk, MD.  ASSISTANJason Coop:  Kayla McKenzie, PA-C.  ANESTHESIA:  General endotracheal anesthesia.  COMPLICATIONS:  None.  DISPOSITION:  Stable.  ESTIMATED BLOOD LOSS:  Minimal.  INDICATIONS FOR SURGERY:  Briefly, Mr. Edward RudRogers is a very pleasant 78 year-old male, who did present to me with pain in the left leg x many years. The patient's MRI did reveal spinal stenosis at L3/4 and L4/5.  We did proceed with appropriate conservative treatment, but the patient did continue to have ongoing pain, which he did feel was limiting his function substantially.  Given the patient's ongoing pain and dysfunction, we did discuss proceeding with the procedure reflected above.  The patient was fully aware of the risks and limitations of surgery and did wish to proceed.  OPERATIVE DETAILS:  On 09/14/2018, the patient was brought to surgery and general endotracheal anesthesia was administered.  The patient was placed prone on a well-padded flat Jackson bed with a jackson frame. Antibiotics were given and the back was prepped and draped in the usual sterile fashion.  A time-out procedure was performed.  I then made a midline incision overlying the L3/4 and L4/5 intervertebral spaces.  The fascia was incised at the midline.  The paraspinal musculature was bluntly retracted laterally and held retracted with a self-retaining retractor. After confirming the appropriate operative levels, I did remove the spinous process of L3 and L4.   At this point, I proceeded with a partial facetectomy on the right and left sides.  Of note, there was  significant overgrowth of the facet joints bilaterally, and there was also very substantial hypertrophy of the ligamentum flavum, particularly on the left at L4/5.  This was causing very severe spinal stenosis.  The lateral recess stenosis was addressed by using Kerrison punches to thoroughly and decompress the right and left lateral recess.  At this point, I carried the decompression up to the L3-4 level.  Once again, a partial facetectomy was performed bilaterally, and I was able to thoroughly and completely decompress the right and left lateral recess and foramen on the right and left sides of the L3-4 level.  I then additionally decompress the right and left lateral recess and right and left neuroforamen at L4-5. At the termination of the decompression, I was able to easily pass a [facility] elevator out the neuroforamen on the right and left sides at both L3-4 and L4-5.  The spinal canal was entirely decompressed.  All bleeding was then adequately controlled.  At this point, 40 mg of Depo-Medrol was introduced about the epidural space.  Prior to this, the wound was copiously irrigated with a total of approximately 2 L of normal saline. Gelfoam was placed over the laminectomy site.  I was very pleased with the decompression.  There was no extravasation of cerebrospinal fluid noted throughout the entire surgery.  At this point, the wound was  closed in layers using #1 Vicryl, followed by 2-0 Vicryl, followed by 4- 0 Monocryl.  Benzoin and Steri-Strips were applied, followed by a sterile dressing.  All instrument counts were correct at the termination of the procedure.  Of note, Pricilla Holm, PA-C, was my assistant throughout surgery, and did aid in retraction, suctioning, and closure from start to finish.     Phylliss Bob, MD

## 2018-09-14 NOTE — Anesthesia Preprocedure Evaluation (Signed)
Anesthesia Evaluation  Patient identified by MRN, date of birth, ID band Patient awake    Reviewed: Allergy & Precautions, NPO status , Patient's Chart, lab work & pertinent test results  Airway Mallampati: III  TM Distance: >3 FB Neck ROM: Full    Dental  (+) Teeth Intact, Dental Advisory Given, Chipped   Pulmonary neg pulmonary ROS,    Pulmonary exam normal breath sounds clear to auscultation       Cardiovascular hypertension, Pt. on medications (-) angina(-) CAD, (-) Past MI, (-) Cardiac Stents and (-) CHF Normal cardiovascular exam Rhythm:Regular Rate:Normal     Neuro/Psych SEVERE LEFT LEG PAIN, LEFT LUMBAR 5 RADICULOPATHY negative psych ROS   GI/Hepatic negative GI ROS, Neg liver ROS,   Endo/Other  negative endocrine ROSObesity   Renal/GU negative Renal ROS     Musculoskeletal negative musculoskeletal ROS (+)   Abdominal   Peds  Hematology negative hematology ROS (+)   Anesthesia Other Findings Day of surgery medications reviewed with the patient.  Reproductive/Obstetrics                             Anesthesia Physical Anesthesia Plan  ASA: II  Anesthesia Plan: General   Post-op Pain Management:    Induction: Intravenous  PONV Risk Score and Plan: 3 and Dexamethasone, Ondansetron and Treatment may vary due to age or medical condition  Airway Management Planned: Oral ETT  Additional Equipment:   Intra-op Plan:   Post-operative Plan: Extubation in OR  Informed Consent: I have reviewed the patients History and Physical, chart, labs and discussed the procedure including the risks, benefits and alternatives for the proposed anesthesia with the patient or authorized representative who has indicated his/her understanding and acceptance.     Dental advisory given  Plan Discussed with: CRNA  Anesthesia Plan Comments: (Risks/benefits of general anesthesia discussed with  patient including risk of damage to teeth, lips, gum, and tongue, nausea/vomiting, allergic reactions to medications, and the possibility of heart attack, stroke and death.  All patient questions answered.  Patient wishes to proceed.)        Anesthesia Quick Evaluation

## 2018-09-14 NOTE — H&P (Signed)
PREOPERATIVE H&P  Chief Complaint: Left leg pain  HPI: Edward Hines is a 78 y.o. male who presents with ongoing pain in the left leg x many years  MRI reveals severe stenosis spanning L3-L5  Patient has failed multiple forms of conservative care and continues to have pain (see office notes for additional details regarding the patient's full course of treatment)  Past Medical History:  Diagnosis Date  . Arthritis    lumbar region  . Hypertension    Past Surgical History:  Procedure Laterality Date  . EYE SURGERY Bilateral    cataracts/w IOL-  implanted   . [place]  . KNEE CARTILAGE SURGERY Left 2005   repair of meniscus   Social History   Socioeconomic History  . Marital status: Married    Spouse name: Not on file  . Number of children: Not on file  . Years of education: Not on file  . Highest education level: Not on file  Occupational History  . Not on file  Social Needs  . Financial resource strain: Not on file  . Food insecurity    Worry: Not on file    Inability: Not on file  . Transportation needs    Medical: Not on file    Non-medical: Not on file  Tobacco Use  . Smoking status: Never Smoker  . Smokeless tobacco: Current User    Types: Snuff  Substance and Sexual Activity  . Alcohol use: Yes    Alcohol/week: 10.0 standard drinks    Types: 10 Glasses of wine per week  . Drug use: Not on file  . Sexual activity: Not on file  Lifestyle  . Physical activity    Days per week: Not on file    Minutes per session: Not on file  . Stress: Not on file  Relationships  . Social Herbalist on phone: Not on file    Gets together: Not on file    Attends religious service: Not on file    Active member of club or organization: Not on file    Attends meetings of clubs or organizations: Not on file    Relationship status: Not on file  Other Topics Concern  . Not on file  Social History Narrative  . Not on file   No family history  on file. No Known Allergies Prior to Admission medications   Medication Sig Start Date End Date Taking? Authorizing Provider  aspirin EC 81 MG tablet Take 81 mg by mouth daily.   Yes [provider]  diltiazem (CARDIZEM CD) 360 MG 24 hr capsule Take 360 mg by mouth daily.   Yes [provider]  diphenhydramine-acetaminophen (TYLENOL PM) 25-500 MG TABS tablet Take 1 tablet by mouth at bedtime.   Yes [provider]  HYDROcodone-acetaminophen (NORCO/VICODIN) 5-325 MG tablet Take 1 tablet by mouth 2 (two) times daily as needed for severe pain.  06/22/18  Yes [provider]  lisinopril-hydrochlorothiazide (ZESTORETIC) 20-25 MG tablet Take 1 tablet by mouth daily.   Yes [provider]  rosuvastatin (CRESTOR) 10 MG tablet Take 10 mg by mouth daily. 08/01/18  Yes [provider]  triamcinolone cream (KENALOG) 0.1 % Apply 1 application topically 2 (two) times daily.   Yes [provider]     All other systems have been reviewed and were otherwise negative with the exception of those mentioned in the HPI and as above.  Physical Exam: There were no vitals filed  for this visit.  There is no height or weight on file to calculate BMI.  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

## 2018-09-14 NOTE — Anesthesia Procedure Notes (Signed)
Procedure Name: Intubation Date/Time: 09/14/2018 2:56 PM Performed by: Candis Shine, CRNA Pre-anesthesia Checklist: Patient identified, Emergency Drugs available, Suction available and Patient being monitored Patient Re-evaluated:Patient Re-evaluated prior to induction Oxygen Delivery Method: Circle System Utilized Preoxygenation: Pre-oxygenation with 100% oxygen Induction Type: IV induction Ventilation: Mask ventilation without difficulty and Oral airway inserted - appropriate to patient size Laryngoscope Size: Mac and 4 Grade View: Grade III Tube type: Oral Tube size: 7.5 mm Number of attempts: 1 Airway Equipment and Method: Oral airway and Bougie stylet Placement Confirmation: ETT inserted through vocal cords under direct vision,  positive ETCO2 and breath sounds checked- equal and bilateral Secured at: 23 cm Tube secured with: Tape Dental Injury: Teeth and Oropharynx as per pre-operative assessment  Difficulty Due To: Difficulty was unanticipated Future Recommendations: Recommend- induction with short-acting agent, and alternative techniques readily available

## 2018-09-14 NOTE — Transfer of Care (Signed)
Immediate Anesthesia Transfer of Care Note  Patient: Edward Hines  Procedure(s) Performed: LUMBAR 3- LUMBAR 5 DECOMPRESSION (N/A )  Patient Location: PACU  Anesthesia Type:General  Level of Consciousness: awake, alert  and oriented  Airway & Oxygen Therapy: Patient Spontanous Breathing and Patient connected to face mask oxygen  Post-op Assessment: Report given to RN and Post -op Vital signs reviewed and stable  Post vital signs: Reviewed and stable  Last Vitals:  Vitals Value Taken Time  BP 122/77 09/14/18 1852  Temp 36.4 C 09/14/18 1852  Pulse 92 09/14/18 1855  Resp 25 09/14/18 1855  SpO2 97 % 09/14/18 1855  Vitals shown include unvalidated device data.  Last Pain:  Vitals:   09/14/18 1852  TempSrc:   PainSc: (P) 0-No pain         Complications: No apparent anesthesia complications

## 2018-09-15 ENCOUNTER — Encounter (HOSPITAL_COMMUNITY): Payer: Self-pay | Admitting: Orthopedic Surgery

## 2018-09-15 NOTE — Anesthesia Postprocedure Evaluation (Signed)
Anesthesia Post Note  Patient: Edward Hines  Procedure(s) Performed: LUMBAR 3- LUMBAR 5 DECOMPRESSION (N/A )     Patient location during evaluation: PACU Anesthesia Type: General Level of consciousness: awake and alert Pain management: pain level controlled Vital Signs Assessment: post-procedure vital signs reviewed and stable Respiratory status: spontaneous breathing, nonlabored ventilation, respiratory function stable and patient connected to nasal cannula oxygen Cardiovascular status: blood pressure returned to baseline and stable Postop Assessment: no apparent nausea or vomiting Anesthetic complications: no    Last Vitals:  Vitals:   09/14/18 1908 09/14/18 1933  BP: (!) 121/102 113/66  Pulse: 90 90  Resp: 14 16  Temp:  36.7 C  SpO2: 95% 95%    Last Pain:  Vitals:   09/14/18 1933  TempSrc:   PainSc: 2    Pain Goal: Patients Stated Pain Goal: 3 (09/14/18 1920)                 Leylany Nored L Jasan Doughtie

## 2018-10-31 ENCOUNTER — Encounter: Payer: Self-pay | Admitting: Podiatry

## 2018-10-31 ENCOUNTER — Other Ambulatory Visit: Payer: Self-pay

## 2018-10-31 ENCOUNTER — Ambulatory Visit: Payer: Medicare Other | Admitting: Podiatry

## 2018-10-31 DIAGNOSIS — M79675 Pain in left toe(s): Secondary | ICD-10-CM

## 2018-10-31 DIAGNOSIS — M79674 Pain in right toe(s): Secondary | ICD-10-CM | POA: Diagnosis not present

## 2018-10-31 DIAGNOSIS — B351 Tinea unguium: Secondary | ICD-10-CM

## 2018-10-31 NOTE — Progress Notes (Signed)
Complaint:  Visit Type: Patient presents  to my office for preventative foot care services. Complaint: Patient states" my nails have grown long and thick and become painful to walk and wear shoes"     Patient has been diagnosed with DM with no foot complications. The patient presents for preventative foot care services. Patient had his back surgery and is doing well.  Podiatric Exam: Vascular: dorsalis pedis and posterior tibial pulses are palpable bilateral. Capillary return is immediate. Temperature gradient is WNL. Skin turgor WNL  Sensorium: Normal Semmes Weinstein monofilament test. Normal tactile sensation bilaterally. Nail Exam: Pt has thick disfigured discolored nails with subungual debris noted bilateral entire nail hallux through fifth toenails Ulcer Exam: There is no evidence of ulcer or pre-ulcerative changes or infection. Orthopedic Exam: Muscle tone and strength are WNL. No limitations in general ROM. No crepitus or effusions noted. Foot type and digits show no abnormalities. Bony prominences are unremarkable. Skin: No Porokeratosis. No infection or ulcers  Diagnosis:  Onychomycosis, , Pain in right toe, pain in left toes  Treatment & Plan Procedures and Treatment: Consent by patient was obtained for treatment procedures.   Debridement of mycotic and hypertrophic toenails, 1 through 5 bilateral and clearing of subungual debris. No ulceration, no infection noted.  Return Visit-Office Procedure: Patient instructed to return to the office for a follow up visit 3 months for continued evaluation and treatment.    Quiera Diffee DPM 

## 2019-01-31 ENCOUNTER — Encounter: Payer: Self-pay | Admitting: Podiatry

## 2019-01-31 ENCOUNTER — Ambulatory Visit: Payer: Medicare Other | Admitting: Podiatry

## 2019-01-31 ENCOUNTER — Other Ambulatory Visit: Payer: Self-pay

## 2019-01-31 DIAGNOSIS — M79675 Pain in left toe(s): Secondary | ICD-10-CM | POA: Diagnosis not present

## 2019-01-31 DIAGNOSIS — B351 Tinea unguium: Secondary | ICD-10-CM

## 2019-01-31 DIAGNOSIS — M79674 Pain in right toe(s): Secondary | ICD-10-CM

## 2019-01-31 NOTE — Progress Notes (Signed)
Complaint:  Visit Type: Patient presents  to my office for preventative foot care services. Complaint: Patient states" my nails have grown long and thick and become painful to walk and wear shoes"     Patient has been diagnosed with DM with no foot complications. The patient presents for preventative foot care services. Patient had his back surgery and is doing well.  Podiatric Exam: Vascular: dorsalis pedis and posterior tibial pulses are palpable bilateral. Capillary return is immediate. Temperature gradient is WNL. Skin turgor WNL  Sensorium: Normal Semmes Weinstein monofilament test. Normal tactile sensation bilaterally. Nail Exam: Pt has thick disfigured discolored nails with subungual debris noted bilateral entire nail hallux through fifth toenails Ulcer Exam: There is no evidence of ulcer or pre-ulcerative changes or infection. Orthopedic Exam: Muscle tone and strength are WNL. No limitations in general ROM. No crepitus or effusions noted. Foot type and digits show no abnormalities. Bony prominences are unremarkable. Skin: No Porokeratosis. No infection or ulcers  Diagnosis:  Onychomycosis, , Pain in right toe, pain in left toes  Treatment & Plan Procedures and Treatment: Consent by patient was obtained for treatment procedures.   Debridement of mycotic and hypertrophic toenails, 1 through 5 bilateral and clearing of subungual debris. No ulceration, no infection noted.  Return Visit-Office Procedure: Patient instructed to return to the office for a follow up visit 3 months for continued evaluation and treatment.    Helane Gunther DPM

## 2019-02-02 ENCOUNTER — Ambulatory Visit: Payer: Medicare Other | Attending: Internal Medicine

## 2019-02-02 DIAGNOSIS — Z23 Encounter for immunization: Secondary | ICD-10-CM | POA: Insufficient documentation

## 2019-02-02 NOTE — Progress Notes (Signed)
   Covid-19 Vaccination Clinic  Name:  Edward Hines    MRN: [id number] DOB: 05/27/1940  02/02/2019  Edward Hines was observed post Covid-19 immunization for 15 minutes without incidence. He was provided with Vaccine Information Sheet and instruction to access the V-Safe system.   Edward Hines was instructed to call 911 with any severe reactions post vaccine: Marland Kitchen Difficulty breathing  . Swelling of your face and throat  . A fast heartbeat  . A bad rash all over your body  . Dizziness and weakness    Immunizations Administered    Name Date Dose VIS Date Route   Pfizer COVID-19 Vaccine 02/02/2019 11:16 AM 0.3 mL 12/29/2018 Intramuscular   Manufacturer: [employer], [employer]   Lot: [id number]   NDC: [address]-8

## 2019-02-23 ENCOUNTER — Ambulatory Visit: Payer: Medicare Other | Attending: Internal Medicine

## 2019-02-23 DIAGNOSIS — Z23 Encounter for immunization: Secondary | ICD-10-CM | POA: Insufficient documentation

## 2019-02-23 NOTE — Progress Notes (Signed)
   Covid-19 Vaccination Clinic  Name:  Edward Hines    MRN: [id number] DOB: [birth year]  02/23/2019  [name] was observed post Covid-19 immunization for 15 minutes without incidence. He was provided with Vaccine Information Sheet and instruction to access the V-Safe system.   [name] was instructed to call 911 with any severe reactions post vaccine: Marland Kitchen Difficulty breathing  . Swelling of your face and throat  . A fast heartbeat  . A bad rash all over your body  . Dizziness and weakness    Immunizations Administered    Name Date Dose VIS Date Route   Pfizer COVID-19 Vaccine 02/23/2019 10:39 AM 0.3 mL 12/29/2018 Intramuscular   Manufacturer: [employer], [employer]   Lot: [id number]   NDC: [address]-0

## 2019-05-02 ENCOUNTER — Encounter: Payer: Self-pay | Admitting: Podiatry

## 2019-05-02 ENCOUNTER — Ambulatory Visit: Payer: Medicare Other | Admitting: Podiatry

## 2019-05-02 ENCOUNTER — Other Ambulatory Visit: Payer: Self-pay

## 2019-05-02 DIAGNOSIS — B351 Tinea unguium: Secondary | ICD-10-CM

## 2019-05-02 DIAGNOSIS — M79675 Pain in left toe(s): Secondary | ICD-10-CM

## 2019-05-02 DIAGNOSIS — M79674 Pain in right toe(s): Secondary | ICD-10-CM | POA: Diagnosis not present

## 2019-05-02 NOTE — Progress Notes (Signed)
This patient returns to the office for evaluation and treatment of long thick painful nails .  This patient is unable to trim his own nails since the patient cannot reach the feet.  Patient says the nails are painful walking and wearing his shoes.  He returns for preventive foot care services.  General Appearance  Alert, conversant and in no acute stress.  Vascular  Dorsalis pedis and posterior tibial  pulses are palpable  bilaterally.  Capillary return is within normal limits  bilaterally. Temperature is within normal limits  bilaterally.  Neurologic  Senn-Weinstein monofilament wire test within normal limits  bilaterally. Muscle power within normal limits bilaterally.  Nails Thick disfigured discolored nails with subungual debris  from hallux to fifth toes bilaterally. No evidence of bacterial infection or drainage bilaterally.  Orthopedic  No limitations of motion  feet .  No crepitus or effusions noted.  No bony pathology or digital deformities noted.  Skin  normotropic skin with no porokeratosis noted bilaterally.  No signs of infections or ulcers noted.     Onychomycosis  Pain in toes right foot  Pain in toes left foot  Debridement  of nails  1-5  B/L with a nail nipper.  Nails were then filed using a dremel tool with no incidents.    RTC    Helane Gunther DPM

## 2019-05-15 DIAGNOSIS — M791 Myalgia, unspecified site: Secondary | ICD-10-CM | POA: Insufficient documentation

## 2019-05-15 DIAGNOSIS — E785 Hyperlipidemia, unspecified: Secondary | ICD-10-CM | POA: Insufficient documentation

## 2019-05-15 DIAGNOSIS — I10 Essential (primary) hypertension: Secondary | ICD-10-CM | POA: Insufficient documentation

## 2019-08-01 ENCOUNTER — Ambulatory Visit: Payer: Medicare Other | Admitting: Podiatry

## 2019-09-14 ENCOUNTER — Other Ambulatory Visit: Payer: Self-pay

## 2019-09-14 ENCOUNTER — Ambulatory Visit: Payer: Medicare Other | Admitting: Podiatry

## 2019-09-14 ENCOUNTER — Encounter: Payer: Self-pay | Admitting: Podiatry

## 2019-09-14 DIAGNOSIS — M79674 Pain in right toe(s): Secondary | ICD-10-CM | POA: Diagnosis not present

## 2019-09-14 DIAGNOSIS — B351 Tinea unguium: Secondary | ICD-10-CM

## 2019-09-14 DIAGNOSIS — M79675 Pain in left toe(s): Secondary | ICD-10-CM | POA: Diagnosis not present

## 2019-09-14 NOTE — Progress Notes (Signed)
This patient returns to the office for evaluation and treatment of long thick painful nails .  This patient is unable to trim his own nails since the patient cannot reach the feet.  Patient says the nails are painful walking and wearing his shoes.  He returns for preventive foot care services.  General Appearance  Alert, conversant and in no acute stress.  Vascular  Dorsalis pedis and posterior tibial  pulses are palpable  bilaterally.  Capillary return is within normal limits  bilaterally. Temperature is within normal limits  bilaterally.  Neurologic  Senn-Weinstein monofilament wire test within normal limits  bilaterally. Muscle power within normal limits bilaterally.  Nails Thick disfigured discolored nails with subungual debris  from hallux to fifth toes bilaterally. No evidence of bacterial infection or drainage bilaterally.  Orthopedic  No limitations of motion  feet .  No crepitus or effusions noted.  No bony pathology or digital deformities noted.  Skin  normotropic skin with no porokeratosis noted bilaterally.  No signs of infections or ulcers noted.     Onychomycosis  Pain in toes right foot  Pain in toes left foot  Debridement  of nails  1-5  B/L with a nail nipper.  Nails were then filed using a dremel tool with no incidents.    RTC 3 months    Argie Lober DPM  

## 2019-12-21 ENCOUNTER — Encounter: Payer: Self-pay | Admitting: Podiatry

## 2019-12-21 ENCOUNTER — Other Ambulatory Visit: Payer: Self-pay

## 2019-12-21 ENCOUNTER — Ambulatory Visit: Payer: Medicare Other | Admitting: Podiatry

## 2019-12-21 DIAGNOSIS — M79674 Pain in right toe(s): Secondary | ICD-10-CM

## 2019-12-21 DIAGNOSIS — B351 Tinea unguium: Secondary | ICD-10-CM

## 2019-12-21 DIAGNOSIS — M79675 Pain in left toe(s): Secondary | ICD-10-CM

## 2019-12-21 NOTE — Progress Notes (Signed)
This patient returns to the office for evaluation and treatment of long thick painful nails .  This patient is unable to trim his own nails since the patient cannot reach the feet.  Patient says the nails are painful walking and wearing his shoes.  He returns for preventive foot care services.  General Appearance  Alert, conversant and in no acute stress.  Vascular  Dorsalis pedis and posterior tibial  pulses are palpable  bilaterally.  Capillary return is within normal limits  bilaterally. Temperature is within normal limits  bilaterally.  Neurologic  Senn-Weinstein monofilament wire test within normal limits  bilaterally. Muscle power within normal limits bilaterally.  Nails Thick disfigured discolored nails with subungual debris  from hallux to fifth toes bilaterally. No evidence of bacterial infection or drainage bilaterally.  Orthopedic  No limitations of motion  feet .  No crepitus or effusions noted.  No bony pathology or digital deformities noted.  Skin  normotropic skin with no porokeratosis noted bilaterally.  No signs of infections or ulcers noted.     Onychomycosis  Pain in toes right foot  Pain in toes left foot  Debridement  of nails  1-5  B/L with a nail nipper.  Nails were then filed using a dremel tool with no incidents.    RTC 3 months    Helane Gunther DPM

## 2020-01-23 ENCOUNTER — Other Ambulatory Visit (HOSPITAL_COMMUNITY): Payer: Self-pay | Admitting: Family Medicine

## 2020-01-23 DIAGNOSIS — M7989 Other specified soft tissue disorders: Secondary | ICD-10-CM

## 2020-03-21 ENCOUNTER — Other Ambulatory Visit: Payer: Self-pay

## 2020-03-21 ENCOUNTER — Encounter: Payer: Self-pay | Admitting: Podiatry

## 2020-03-21 ENCOUNTER — Ambulatory Visit: Payer: Medicare Other | Admitting: Podiatry

## 2020-03-21 DIAGNOSIS — M79674 Pain in right toe(s): Secondary | ICD-10-CM | POA: Diagnosis not present

## 2020-03-21 DIAGNOSIS — B351 Tinea unguium: Secondary | ICD-10-CM

## 2020-03-21 DIAGNOSIS — M79675 Pain in left toe(s): Secondary | ICD-10-CM

## 2020-03-21 NOTE — Progress Notes (Signed)
This patient returns to the office for evaluation and treatment of long thick painful nails .  This patient is unable to trim his own nails since the patient cannot reach the feet.  Patient says the nails are painful walking and wearing his shoes.  He returns for preventive foot care services.  General Appearance  Alert, conversant and in no acute stress.  Vascular  Dorsalis pedis and posterior tibial  pulses are palpable  bilaterally.  Capillary return is within normal limits  bilaterally. Temperature is within normal limits  bilaterally.  Neurologic  Senn-Weinstein monofilament wire test within normal limits  bilaterally. Muscle power within normal limits bilaterally.  Nails Thick disfigured discolored nails with subungual debris  from hallux to fifth toes bilaterally. No evidence of bacterial infection or drainage bilaterally.  Orthopedic  No limitations of motion  feet .  No crepitus or effusions noted.  No bony pathology or digital deformities noted.  Skin  normotropic skin with no porokeratosis noted bilaterally.  No signs of infections or ulcers noted.     Onychomycosis  Pain in toes right foot  Pain in toes left foot  Debridement  of nails  1-5  B/L with a nail nipper.  Nails were then filed using a dremel tool with no incidents.    RTC 3 months    Gregory Mayer DPM  

## 2020-06-25 ENCOUNTER — Ambulatory Visit: Payer: Medicare Other | Admitting: Podiatry

## 2020-08-30 IMAGING — CR LUMBAR SPINE - 2-3 VIEW
2 series · 2 of 2 positions shown · non-contrast
Comparison: 07/17/2018 MR

CLINICAL DATA: L3-L5 decompression

EXAM:
LUMBAR SPINE - 2-3 VIEW

[lateral (1 of 2)]
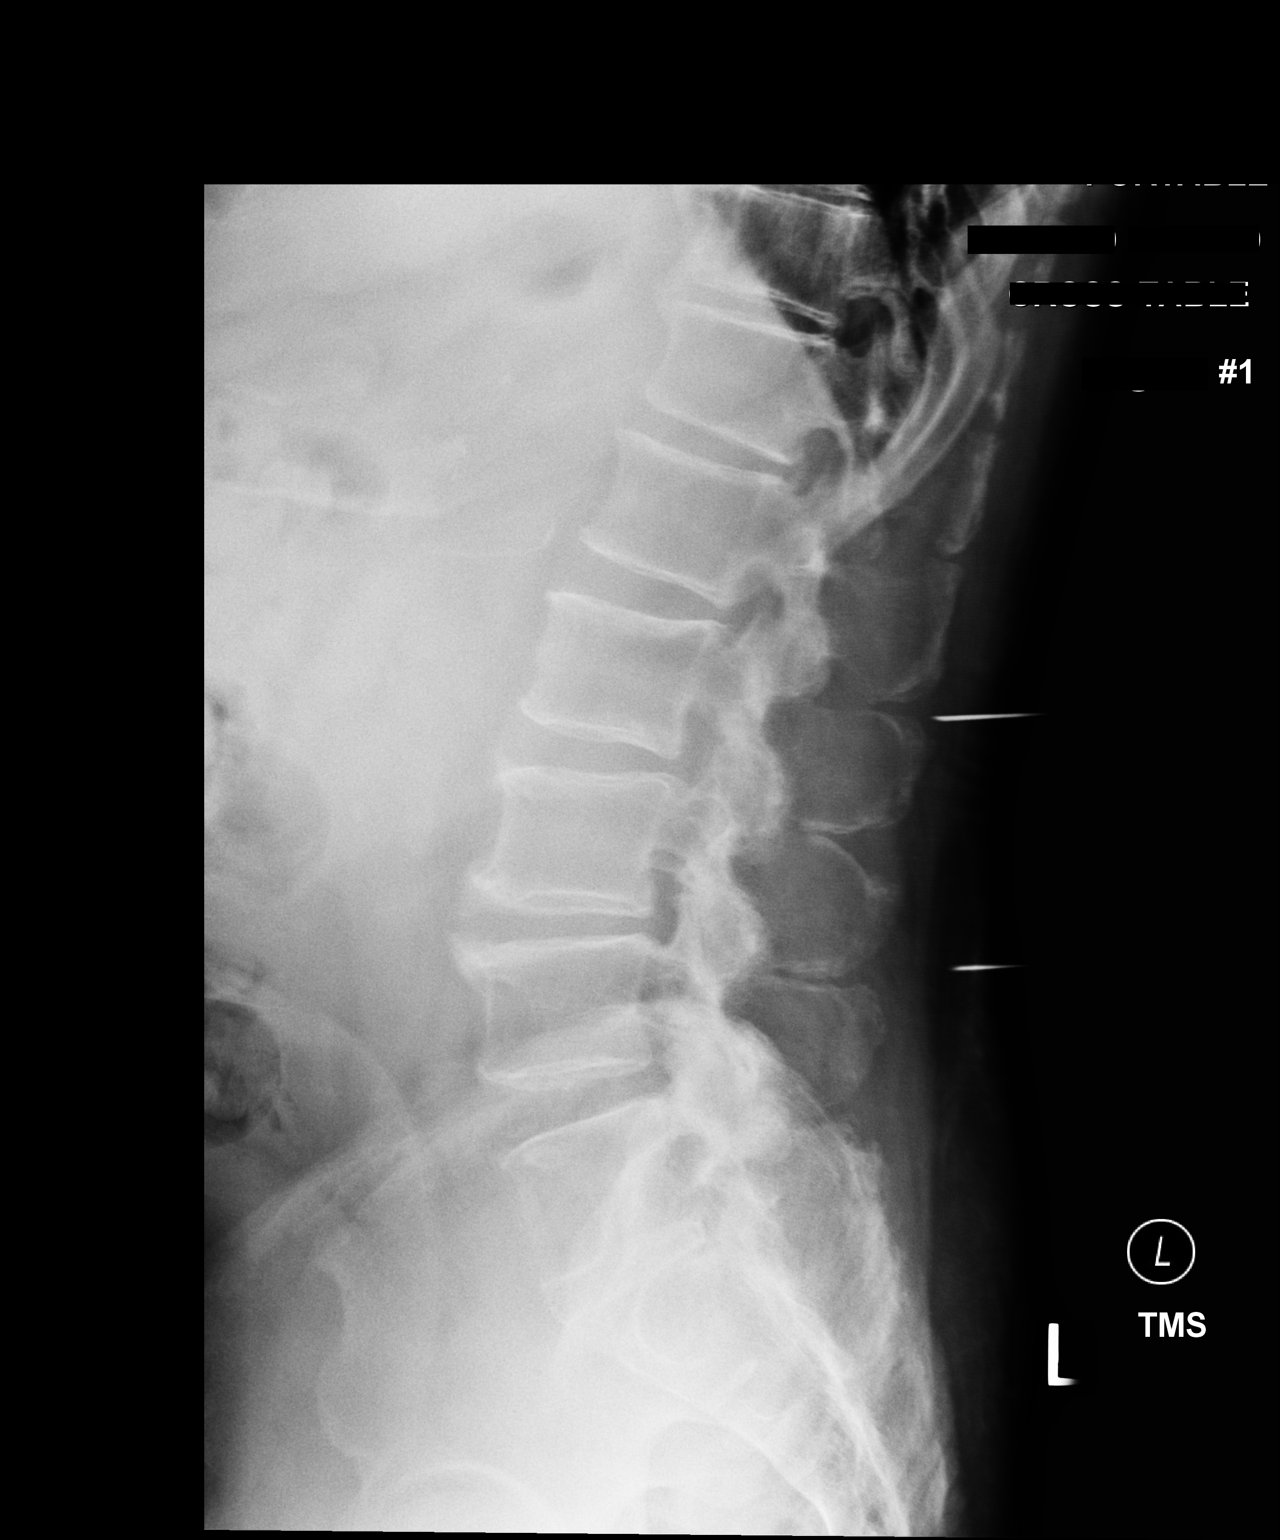

[lateral (2 of 2)]
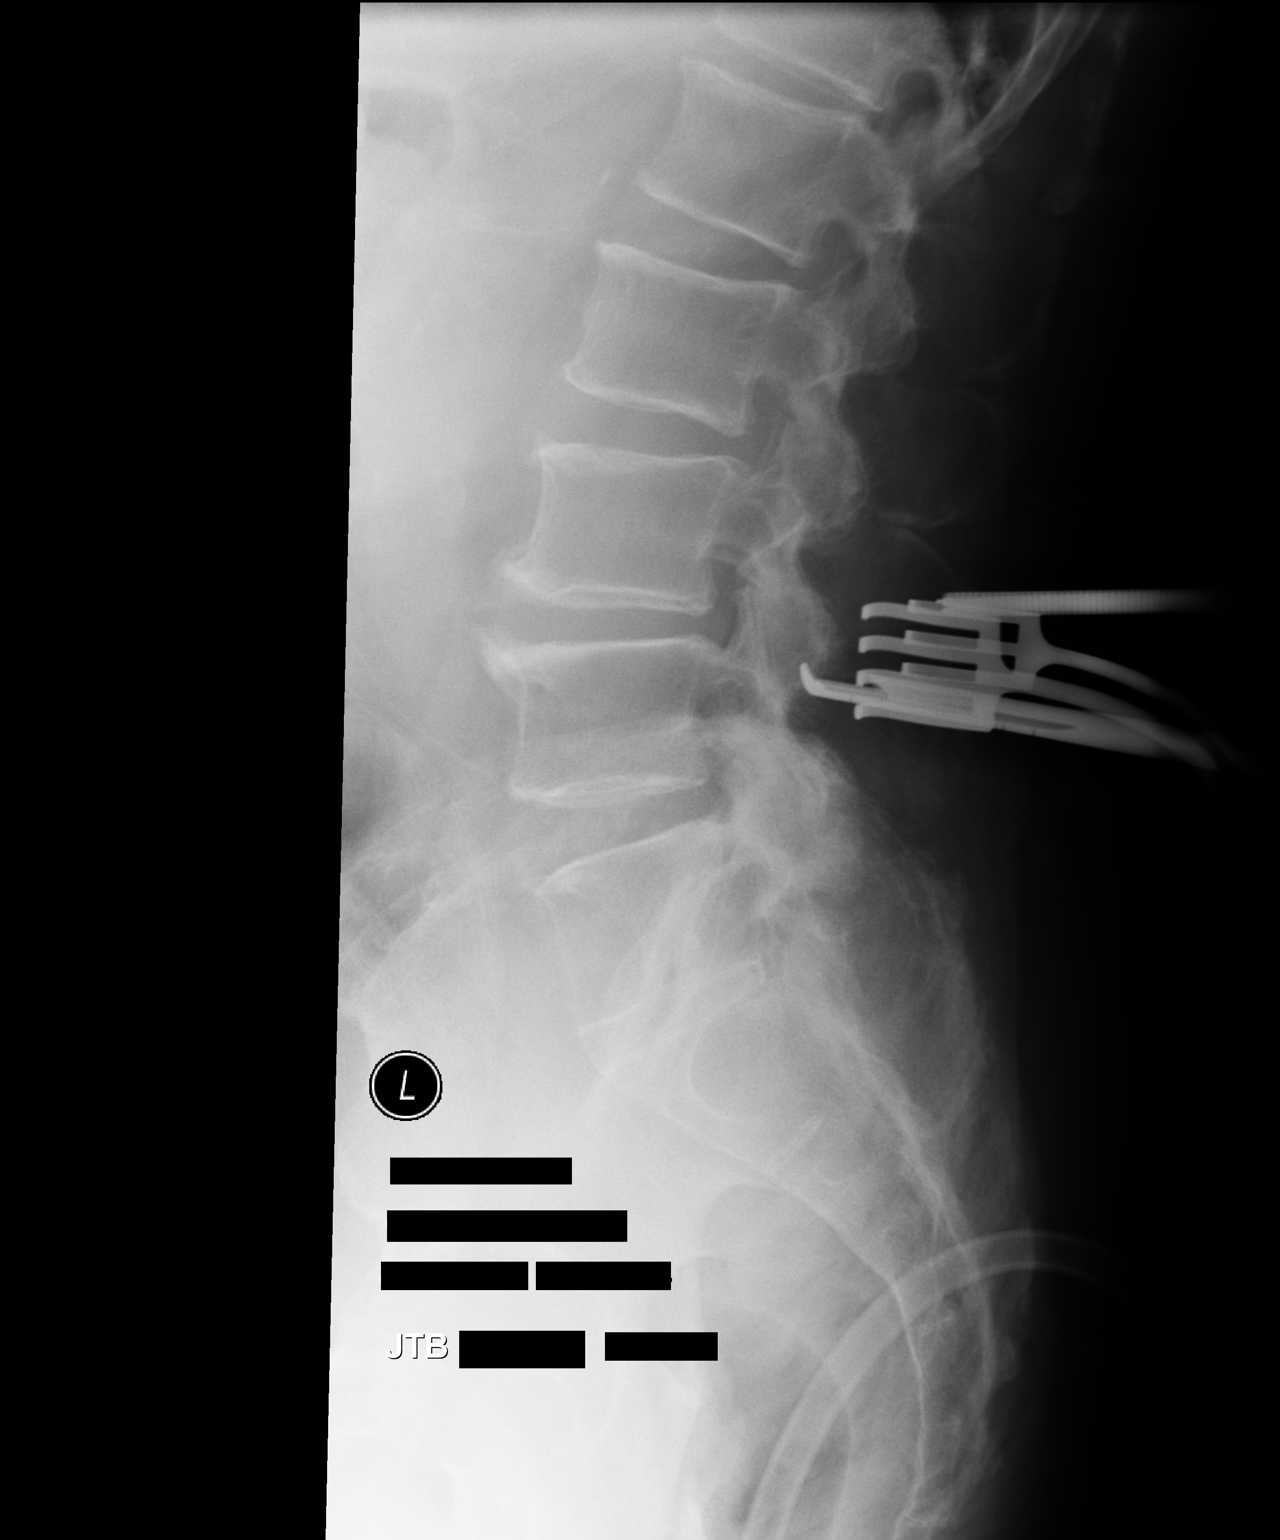

[2 of 2 positions shown; findings below may reference images not displayed]

FINDINGS: The same numbering system will be utilized as on recent MRI.

Intraoperative LATERAL views of the lumbar spine are submitted.

On film 1, 2 posterior metallic probes are noted with the SUPERIOR
probe directed between the spinous processes of L1-L2 and the
INFERIOR probe directed between the spinous processes of L3 and L4.

On film 2, posterior metallic probe is directed towards the L3-L4
interspace.
IMPRESSION: Localizers

## 2021-02-09 ENCOUNTER — Encounter: Payer: Self-pay | Admitting: Podiatry

## 2021-02-09 ENCOUNTER — Other Ambulatory Visit: Payer: Self-pay

## 2021-02-09 ENCOUNTER — Ambulatory Visit: Payer: Medicare Other | Admitting: Podiatry

## 2021-02-09 DIAGNOSIS — B351 Tinea unguium: Secondary | ICD-10-CM | POA: Diagnosis not present

## 2021-02-09 DIAGNOSIS — M79675 Pain in left toe(s): Secondary | ICD-10-CM | POA: Diagnosis not present

## 2021-02-09 DIAGNOSIS — M79674 Pain in right toe(s): Secondary | ICD-10-CM

## 2021-02-09 NOTE — Progress Notes (Signed)
This patient returns to the office for evaluation and treatment of long thick painful nails .  This patient is unable to trim his own nails since the patient cannot reach the feet.  Patient says the nails are painful walking and wearing his shoes.  He returns for preventive foot care services.  General Appearance  Alert, conversant and in no acute stress.  Vascular  Dorsalis pedis and posterior tibial  pulses are palpable  bilaterally.  Capillary return is within normal limits  bilaterally. Temperature is within normal limits  bilaterally.  Neurologic  Senn-Weinstein monofilament wire test within normal limits  bilaterally. Muscle power within normal limits bilaterally.  Nails Thick disfigured discolored nails with subungual debris  from hallux to fifth toes bilaterally. No evidence of bacterial infection or drainage bilaterally.  Orthopedic  No limitations of motion  feet .  No crepitus or effusions noted.  No bony pathology or digital deformities noted.  Skin  normotropic skin with no porokeratosis noted bilaterally.  No signs of infections or ulcers noted.     Onychomycosis  Pain in toes right foot  Pain in toes left foot  Debridement  of nails  1-5  B/L with a nail nipper.  Nails were then filed using a dremel tool with no incidents.    RTC 3 months    Tieasha Larsen DPM  

## 2021-05-13 ENCOUNTER — Encounter: Payer: Self-pay | Admitting: Podiatry

## 2021-05-13 ENCOUNTER — Ambulatory Visit: Payer: Medicare Other | Admitting: Podiatry

## 2021-05-13 DIAGNOSIS — M79674 Pain in right toe(s): Secondary | ICD-10-CM | POA: Diagnosis not present

## 2021-05-13 DIAGNOSIS — B351 Tinea unguium: Secondary | ICD-10-CM

## 2021-05-13 DIAGNOSIS — M79675 Pain in left toe(s): Secondary | ICD-10-CM

## 2021-05-13 NOTE — Progress Notes (Signed)
This patient returns to the office for evaluation and treatment of long thick painful nails .  This patient is unable to trim his own nails since the patient cannot reach the feet.  Patient says the nails are painful walking and wearing his shoes.  He returns for preventive foot care services.  General Appearance  Alert, conversant and in no acute stress.  Vascular  Dorsalis pedis and posterior tibial  pulses are palpable  bilaterally.  Capillary return is within normal limits  bilaterally. Temperature is within normal limits  bilaterally.  Neurologic  Senn-Weinstein monofilament wire test within normal limits  bilaterally. Muscle power within normal limits bilaterally.  Nails Thick disfigured discolored nails with subungual debris  from hallux to fifth toes bilaterally. No evidence of bacterial infection or drainage bilaterally.  Orthopedic  No limitations of motion  feet .  No crepitus or effusions noted.  No bony pathology or digital deformities noted.  Skin  normotropic skin with no porokeratosis noted bilaterally.  No signs of infections or ulcers noted.     Onychomycosis  Pain in toes right foot  Pain in toes left foot  Debridement  of nails  1-5  B/L with a nail nipper.  Nails were then filed using a dremel tool with no incidents.    RTC 3 months    Jadon Harbaugh DPM  

## 2021-08-19 ENCOUNTER — Encounter: Payer: Self-pay | Admitting: Podiatry

## 2021-08-19 ENCOUNTER — Ambulatory Visit: Payer: Medicare Other | Admitting: Podiatry

## 2021-08-19 DIAGNOSIS — M79674 Pain in right toe(s): Secondary | ICD-10-CM

## 2021-08-19 DIAGNOSIS — M79675 Pain in left toe(s): Secondary | ICD-10-CM | POA: Diagnosis not present

## 2021-08-19 DIAGNOSIS — B351 Tinea unguium: Secondary | ICD-10-CM

## 2021-08-19 NOTE — Progress Notes (Signed)
This patient returns to the office for evaluation and treatment of long thick painful nails .  This patient is unable to trim his own nails since the patient cannot reach the feet.  Patient says the nails are painful walking and wearing his shoes.  He returns for preventive foot care services.  General Appearance  Alert, conversant and in no acute stress.  Vascular  Dorsalis pedis and posterior tibial  pulses are palpable  bilaterally.  Capillary return is within normal limits  bilaterally. Temperature is within normal limits  bilaterally.  Neurologic  Senn-Weinstein monofilament wire test within normal limits  bilaterally. Muscle power within normal limits bilaterally.  Nails Thick disfigured discolored nails with subungual debris  from hallux to fifth toes bilaterally. No evidence of bacterial infection or drainage bilaterally.  Orthopedic  No limitations of motion  feet .  No crepitus or effusions noted.  No bony pathology or digital deformities noted.  Skin  normotropic skin with no porokeratosis noted bilaterally.  No signs of infections or ulcers noted.     Onychomycosis  Pain in toes right foot  Pain in toes left foot  Debridement  of nails  1-5  B/L with a nail nipper.  Nails were then filed using a dremel tool with no incidents.    RTC 3 months    Jenniger Figiel DPM  

## 2021-10-22 LAB — COLOGUARD: COLOGUARD: NEGATIVE

## 2021-11-25 ENCOUNTER — Ambulatory Visit: Payer: Medicare Other | Admitting: Podiatry

## 2022-02-17 ENCOUNTER — Ambulatory Visit: Payer: Medicare Other | Admitting: Podiatry

## 2022-02-17 ENCOUNTER — Encounter: Payer: Self-pay | Admitting: Podiatry

## 2022-02-17 DIAGNOSIS — M79674 Pain in right toe(s): Secondary | ICD-10-CM | POA: Diagnosis not present

## 2022-02-17 DIAGNOSIS — B351 Tinea unguium: Secondary | ICD-10-CM | POA: Diagnosis not present

## 2022-02-17 DIAGNOSIS — M79675 Pain in left toe(s): Secondary | ICD-10-CM | POA: Diagnosis not present

## 2022-02-17 NOTE — Progress Notes (Signed)
Subjective:   Patient ID: Edward Hines, male   DOB: 82 y.o.   MRN: [id number]   HPI Patient presents with thickened and elongated nailbeds 1-5 both feet that are painful and he cannot cut himself   ROS      Objective:  Physical Exam  Neurovascular status intact with thick yellow brittle nailbeds 1-5 both feet     Assessment:  Chronic mycotic nail infection with pain 1-5 both feet     Plan:  Debridement nailbeds 1-5 both feet neurogenic bleeding reappoint routine care

## 2022-05-27 ENCOUNTER — Other Ambulatory Visit (HOSPITAL_COMMUNITY): Payer: Self-pay | Admitting: Family Medicine

## 2022-05-27 DIAGNOSIS — R6889 Other general symptoms and signs: Secondary | ICD-10-CM

## 2022-06-18 ENCOUNTER — Ambulatory Visit: Payer: Medicare Other | Admitting: Podiatry

## 2022-06-18 ENCOUNTER — Encounter: Payer: Self-pay | Admitting: Podiatry

## 2022-06-18 DIAGNOSIS — M79675 Pain in left toe(s): Secondary | ICD-10-CM | POA: Diagnosis not present

## 2022-06-18 DIAGNOSIS — M79674 Pain in right toe(s): Secondary | ICD-10-CM | POA: Diagnosis not present

## 2022-06-18 DIAGNOSIS — B351 Tinea unguium: Secondary | ICD-10-CM

## 2022-06-21 NOTE — Progress Notes (Signed)
Subjective:   Patient ID: Edward Hines, male   DOB: 82 y.o.   MRN: [id number]   HPI Patient presents with elongated nailbeds 1-5 both feet that are thick yellow brittle and painful   ROS      Objective:  Physical Exam  Neurovascular status intact thick yellow brittle nailbeds 1-5 both feet that are painful he cannot cut     Assessment:  Chronic mycotic nail infection 1-5 both feet     Plan:  Debridement nailbeds 1-5 both feet no angiogenic bleeding reappoint routine care

## 2022-06-25 ENCOUNTER — Ambulatory Visit (HOSPITAL_COMMUNITY): Payer: Medicare Other | Attending: Family Medicine

## 2022-06-25 DIAGNOSIS — R6889 Other general symptoms and signs: Secondary | ICD-10-CM | POA: Diagnosis not present

## 2022-06-25 DIAGNOSIS — I10 Essential (primary) hypertension: Secondary | ICD-10-CM | POA: Diagnosis not present

## 2022-06-25 LAB — ECHOCARDIOGRAM COMPLETE
Area-P 1/2: 2.45 cm2
S' Lateral: 3.1 cm

## 2022-08-04 ENCOUNTER — Institutional Professional Consult (permissible substitution): Payer: Medicare Other | Admitting: Surgical

## 2022-08-04 VITALS — BP 92/60 | HR 86 | Resp 20 | Ht 67.0 in | Wt 230.0 lb

## 2022-08-04 DIAGNOSIS — I7121 Aneurysm of the ascending aorta, without rupture: Secondary | ICD-10-CM

## 2022-08-04 NOTE — Progress Notes (Signed)
Subjective:     Patient ID: Edward Hines, male    DOB: Mar 04, 1940, 82 y.o.   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  See the full report below.  He is asymptomatic in this regard.  Also noted are some small pulmonary nodules.  I did not have access to the scans and these are based on the report.  He does not get chest pain but does get some shortness of breath and in particular dyspnea on exertion.  Cardiogram has also been performed and the results are described below.  He has a tricuspid aortic valve.  He does not have any aortic regurgitation.  He has a non-smoker.  He reports blood pressure is under good control.  He previously required narcotics for pain associated with back surgery but is no longer on this.  He has poor dentition but does see a dentist every 6 months.  Review of Systems  Constitutional:  Positive for malaise/fatigue.  Respiratory:  Positive for shortness of breath.        Sleep apnea  Gastrointestinal:  Positive for constipation.  Genitourinary:        BPH  Musculoskeletal:  Positive for joint pain.  Neurological:        Numbness in his feet    Past Medical History:  Diagnosis Date   Arthritis    lumbar region   Hypertension     Past Surgical History:  Procedure Laterality Date   EYE SURGERY Bilateral    cataracts/w IOL-  implanted    HEMORROIDECTOMY  1964   KNEE CARTILAGE SURGERY Left 2005   repair of meniscus   LUMBAR LAMINECTOMY/DECOMPRESSION MICRODISCECTOMY N/A 09/14/2018   Procedure: LUMBAR 3- LUMBAR 5 DECOMPRESSION;  Surgeon: Estill Bamberg, MD;  Location: [facility] OR;  Service: Orthopedics;  Laterality: N/A;       Current Outpatient Medications:    diltiazem (CARDIZEM CD) 360 MG 24 hr capsule, Take 360 mg by mouth daily., Disp: , Rfl:    diphenhydramine-acetaminophen  (TYLENOL PM) 25-500 MG TABS tablet, Take 1 tablet by mouth at bedtime., Disp: , Rfl:    DULoxetine (CYMBALTA) 20 MG capsule, Take 20 mg by mouth 2 (two) times daily., Disp: , Rfl:    ergocalciferol (VITAMIN D2) 1.25 MG (50000 UT) capsule, TAKE 1 CAPSULES BY MOUTH MONDAY AND THURSDAY WEEKLY X 4 WEEKLY SECONDARY TO VERY LOW VITAMIN D, Disp: , Rfl:    gabapentin (NEURONTIN) 100 MG capsule, 1 CAP AT BEDTIME X 5 DAYS, THEN INCREASE TO 2 CAPS AT BEDTIME X 5 DAYS THEN INCREASE TO 3 CAPS, Disp: , Rfl:    HYDROcodone-acetaminophen (NORCO) 10-325 MG tablet, Take 1 tablet by mouth 3 (three) times daily as needed., Disp: , Rfl:    ketoconazole (NIZORAL) 2 % shampoo, SMARTSIG:5 Milliliter(s) Topical Once a Week, Disp: , Rfl:    lisinopril-hydrochlorothiazide (ZESTORETIC) 20-25 MG tablet, Take 1 tablet by mouth daily., Disp: , Rfl:    losartan (COZAAR) 25 MG tablet, , Disp: , Rfl:    meloxicam (MOBIC) 15 MG tablet, Take 15 mg by mouth daily., Disp: , Rfl:    mupirocin ointment (BACTROBAN) 2 %, , Disp: , Rfl:    oxyCODONE-acetaminophen (PERCOCET/ROXICET) 5-325 MG tablet, TAKE 1 TABLET BY MOUTH EVERY 4 (FOUR) HOURS AS NEEDED FOR UP TO 7  DAYS FOR SEVERE PAIN., Disp: , Rfl:    pravastatin (PRAVACHOL) 10 MG tablet, Take by mouth., Disp: , Rfl:    pregabalin (LYRICA) 75 MG capsule, , Disp: , Rfl:    rosuvastatin (CRESTOR) 10 MG tablet, Take 10 mg by mouth daily., Disp: , Rfl:    tamsulosin (FLOMAX) 0.4 MG CAPS capsule, Take 0.4 mg by mouth every morning., Disp: , Rfl:    triamcinolone cream (KENALOG) 0.1 %, Apply 1 application topically 2 (two) times daily., Disp: , Rfl:       No Known Allergies   Social History   Occupational History   Not on file  Tobacco Use   Smoking status: Never   Smokeless tobacco: Current    Types: Snuff  Substance and Sexual Activity   Alcohol use: Yes    Alcohol/week: 10.0 standard drinks of alcohol    Types: 10 Glasses of wine per week   Drug use: Not on file   Sexual  activity: Not on file       Objective:    Ht 5\' 7"  (1.702 m)   BMI 34.71 kg/m  BP Readings from Last 3 Encounters:  08/04/22 92/60  09/14/18 113/66  09/05/18 (!) 147/93   Wt Readings from Last 3 Encounters:  08/04/22 230 lb (104.3 kg)  09/14/18 221 lb 9.6 oz (100.5 kg)  09/05/18 221 lb 9.6 oz (100.5 kg)      Physical Exam Constitutional:      Appearance: He is obese. He is not ill-appearing.  HENT:     Head: Normocephalic and atraumatic.     Mouth/Throat:     Mouth: Mucous membranes are moist.  Eyes:     Extraocular Movements: Extraocular movements intact.     Pupils: Pupils are equal, round, and reactive to light.  Neck:     Vascular: No carotid bruit.  Cardiovascular:     Rate and Rhythm: Normal rate and regular rhythm.     Heart sounds: No murmur heard.    Comments: Palpable DP bilateral Pulmonary:     Effort: Pulmonary effort is normal.     Breath sounds: Normal breath sounds.  Abdominal:     General: There is no distension.     Palpations: Abdomen is soft. There is no mass.     Tenderness: There is no abdominal tenderness.  Musculoskeletal:     Cervical back: Normal range of motion.     Right lower leg: Edema present.     Left lower leg: Edema present.  Skin:    Capillary Refill: Capillary refill takes less than 2 seconds.     Coloration: Skin is not jaundiced or pale.  Neurological:     Mental Status: He is alert.     Motor: Weakness present.     Comments: Grossly decreased sensation in his feet     No results found for any visits on 08/04/22. Anatomical Region Laterality Modality  Chest -- Computed Tomography   Impression  IMPRESSION: 1.  Ascending aortic aneurysm measures up to 4.8 x 4.7 cm. Recommend surveillance imaging. 2.  Atherosclerosis including coronary artery disease. 3.  Pulmonary nodules measure up to 5 mm along the right major fissure. 4.  Hepatic steatosis. 5.  Sequela of remote granulomatous infection. Excerpt from  "Guidelines for Management of Incidental Pulmonary Nodules Detected on CT Images: From the Fleischner Society 2017"  Radiology 2017.   Note: Guidelines do not apply to lung cancer screening, patients with immunosuppression, or patients with known primary cancer. Multiple  nodules 5mm or less: Low risk- No routine follow up. High risk- Optional CT at 12 months. (Nodules less than 6 mm do not require routine follow-up, but certain patients at high risk with suspicious nodule morphology, upper lobe location, or both may warrant 12 month follow up). Electronically Signed by: Maurene Capes, MD on 07/14/2022 10:45 AM Narrative  CT CHEST W CONTRAST INDICATION: Aneurysm (#) COMPARISON: None. TECHNIQUE:  CT CHEST W CONTRAST -80 mL Isovue-370  Radiation dose reduction was utilized (automated exposure control, mA or kV adjustment based on patient size, or iterative image reconstruction). FINDINGS: Support apparatus: #   N.A. Thoracic inlet: #  Visualized thyroid is unremarkable. #  The central airways are patent. Heart: #  Heart size is within normal limits. #  No pericardial effusion. #  Coronary artery disease. Vessels: #  Aneurysmal dilatation of ascending aorta to 4.8 x 4.7 cm (series 2, image 66; series 605, image 84; series 601, image 107).   #  Atherosclerosis of multiple arterial structures including the coronary arteries. #  No central pulmonary embolism identified. Mediastinum/hila: #  Tiny hiatal hernia. #  No suspicious adenopathy. #  Small calcifications in the left hilum. Lungs: #  No acute consolidation. #  Calcified granuloma in the left lower lobe. #  Calcified granuloma in the right lower lobe. #  4 mm nodule in the right lower lobe (series 3, image 65). #  5 mm nodule along the right major fissure (series 3, image 67). #  Mild bronchiectasis in the lower lobes. Pleura: #  No pleural effusion. #  No pneumothorax. Upper abdomen: #  Hepatic steatosis. #  Calcified  granulomas in the spleen. #  Probable small right adrenal adenoma. #  Atherosclerosis. Musculoskeletal: #  No acute osseous findings. #  Multilevel degenerative changes of the spine. #  Diffuse idiopathic skeletal hyperostosis. #  Glenohumeral joint degenerative changes. Procedure Note  Maurene Capes, MD - 07/14/2022 Formatting of this note might be different from the original. CT CHEST W CONTRAST INDICATION: Aneurysm (#) COMPARISON: None. TECHNIQUE:  CT CHEST W CONTRAST -80 mL Isovue-370  Radiation dose reduction was utilized (automated exposure control, mA or kV adjustment based on patient size, or iterative image reconstruction). FINDINGS: Support apparatus: #   N.A. Thoracic inlet: #  Visualized thyroid is unremarkable. #  The central airways are patent. Heart: #  Heart size is within normal limits. #  No pericardial effusion. #  Coronary artery disease. Vessels: #  Aneurysmal dilatation of ascending aorta to 4.8 x 4.7 cm (series 2, image 66; series 605, image 84; series 601, image 107).   #  Atherosclerosis of multiple arterial structures including the coronary arteries. #  No central pulmonary embolism identified. Mediastinum/hila: #  Tiny hiatal hernia. #  No suspicious adenopathy. #  Small calcifications in the left hilum. Lungs: #  No acute consolidation. #  Calcified granuloma in the left lower lobe. #  Calcified granuloma in the right lower lobe. #  4 mm nodule in the right lower lobe (series 3, image 65). #  5 mm nodule along the right major fissure (series 3, image 67). #  Mild bronchiectasis in the lower lobes. Pleura: #  No pleural effusion. #  No pneumothorax. Upper abdomen: #  Hepatic steatosis. #  Calcified granulomas in the spleen. #  Probable small right adrenal adenoma. #  Atherosclerosis. Musculoskeletal: #  No acute osseous findings. #  Multilevel degenerative changes of the spine. #  Diffuse  idiopathic skeletal hyperostosis. #   Glenohumeral joint degenerative changes.  IMPRESSION: 1.  Ascending aortic aneurysm measures up to 4.8 x 4.7 cm. Recommend surveillance imaging. 2.  Atherosclerosis including coronary artery disease. 3.  Pulmonary nodules measure up to 5 mm along the right major fissure. 4.  Hepatic steatosis. 5.  Sequela of remote granulomatous infection. Excerpt from "Guidelines for Management of Incidental Pulmonary Nodules Detected on CT Images: From the Fleischner Society 2017"  Radiology 2017.   Note: Guidelines do not apply to lung cancer screening, patients with immunosuppression, or patients with known primary cancer. Multiple nodules 5mm or less: Low risk- No routine follow up. High risk- Optional CT at 12 months. (Nodules less than 6 mm do not require routine follow-up, but certain patients at high risk with suspicious nodule morphology, upper lobe location, or both may warrant 12 month follow up). Electronically Signed by: Maurene Capes, MD on 07/14/2022 10:45 AM     ECHOCARDIOGRAM REPORT       Patient Name:   Edward Hines  Date of Exam: 06/25/2022  Medical Rec #:  [id number]     Height:       67.0 in  Accession #:    [id number]    Weight:       221.6 lb  Date of Birth:  1940-09-01     BSA:          2.112 m  Patient Age:    82 years      BP:           122/72 mmHg  Patient Gender: M             HR:           106 bpm.  Exam Location:  [address]   Procedure: 2D Echo, Cardiac Doppler and Color Doppler   Indications:    R68.89 Exercise Intolerance    History:        Patient has no prior history of Echocardiogram  examinations.                 Risk Factors:Hypertension.    Sonographer:    NaTashia Rodgers-Jones RDCS  Referring Phys: [id number] KAREN L RICHTER   IMPRESSIONS     1. Left ventricular ejection fraction, by estimation, is 60 to 65%. The  left ventricle has normal function. The left ventricle has no regional  wall motion abnormalities. Left ventricular diastolic parameters are   consistent with Grade I diastolic  dysfunction (impaired relaxation).   2. Right ventricular systolic function is normal. The right ventricular  size is normal. Tricuspid regurgitation signal is inadequate for assessing  PA pressure.   3. The mitral valve is grossly normal. No evidence of mitral valve  regurgitation.   4. The aortic valve is tricuspid. Aortic valve regurgitation is not  visualized. Aortic valve sclerosis/calcification is present, without any  evidence of aortic stenosis.   5. Aortic dilatation noted. There is severe dilatation of the ascending  aorta, measuring 50 mm.   6. The inferior vena cava is normal in size with greater than 50%  respiratory variability, suggesting right atrial pressure of 3 mmHg.   Comparison(s): No prior Echocardiogram.   FINDINGS   Left Ventricle: Left ventricular ejection fraction, by estimation, is 60  to 65%. The left ventricle has normal function. The left ventricle has no  regional wall motion abnormalities. The left ventricular internal cavity  size was normal in size. There is   no left ventricular hypertrophy.  Left ventricular diastolic parameters  are consistent with Grade I diastolic dysfunction (impaired relaxation).  Indeterminate filling pressures.   Right Ventricle: The right ventricular size is normal. No increase in  right ventricular wall thickness. Right ventricular systolic function is  normal. Tricuspid regurgitation signal is inadequate for assessing PA  pressure.   Left Atrium: Left atrial size was normal in size.   Right Atrium: Right atrial size was normal in size.   Pericardium: There is no evidence of pericardial effusion.   Mitral Valve: The mitral valve is grossly normal. No evidence of mitral  valve regurgitation.   Tricuspid Valve: The tricuspid valve is grossly normal. Tricuspid valve  regurgitation is trivial.   Aortic Valve: The aortic valve is tricuspid. Aortic valve regurgitation is  not  visualized. Aortic valve sclerosis/calcification is present, without  any evidence of aortic stenosis.   Pulmonic Valve: The pulmonic valve was normal in structure. Pulmonic valve  regurgitation is not visualized.   Aorta: Aortic dilatation noted. There is severe dilatation of the  ascending aorta, measuring 50 mm.   Venous: The inferior vena cava is normal in size with greater than 50%  respiratory variability, suggesting right atrial pressure of 3 mmHg.   IAS/Shunts: No atrial level shunt detected by color flow Doppler.     LEFT VENTRICLE  PLAX 2D  LVIDd:         4.90 cm   Diastology  LVIDs:         3.10 cm   LV e' medial:    6.69 cm/s  LV PW:         0.90 cm   LV E/e' medial:  8.3  LV IVS:        0.90 cm   LV e' lateral:   5.88 cm/s  LVOT diam:     2.20 cm   LV E/e' lateral: 9.4  LV SV:         73  LV SV Index:   34  LVOT Area:     3.80 cm     RIGHT VENTRICLE             IVC  RV Basal diam:  2.90 cm     IVC diam: 1.40 cm  RV S prime:     15.35 cm/s  TAPSE (M-mode): 2.0 cm   LEFT ATRIUM             Index        RIGHT ATRIUM           Index  LA diam:        4.50 cm 2.13 cm/m   RA Area:     11.30 cm  LA Vol (A2C):   40.9 ml 19.36 ml/m  RA Volume:   26.90 ml  12.73 ml/m  LA Vol (A4C):   43.1 ml 20.40 ml/m  LA Biplane Vol: 42.4 ml 20.07 ml/m   AORTIC VALVE  LVOT Vmax:   100.97 cm/s  LVOT Vmean:  68.500 cm/s  LVOT VTI:    0.192 m    AORTA  Ao Root diam: 4.00 cm  Ao Asc diam:  5.00 cm   MITRAL VALVE  MV Area (PHT): 2.45 cm    SHUNTS  MV Decel Time: 310 msec    Systemic VTI:  0.19 m  MV E velocity: 55.50 cm/s  Systemic Diam: 2.20 cm  MV A velocity: 83.80 cm/s  MV E/A ratio:  0.66   Zoila Shutter MD  Electronically signed by  Zoila Shutter MD  Signature Date/Time: 06/25/2022/4:45:04 PM        Final       Assessment & Plan:  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  Will obtain a repeat chest CTA in 6 months and have him follow-up with MD.  A also will  include surveillance of lung nodules.  Discussed need for good control of blood pressure and other comorbidities as well as lifestyle activities that maximize health such as good nutrition, exercise and sleep.     Problem List Items Addressed This Visit   None Visit Diagnoses     Aneurysm of ascending aorta without rupture (HCC)    -  Primary       No orders of the defined types were placed in this encounter.   No follow-ups on file.  Rowe Clack, PA-C

## 2022-08-04 NOTE — Patient Instructions (Signed)
Keep good control of blood pressure and comorbidities as able.

## 2022-08-28 DIAGNOSIS — I712 Thoracic aortic aneurysm, without rupture, unspecified: Secondary | ICD-10-CM | POA: Insufficient documentation

## 2022-08-28 NOTE — Progress Notes (Unsigned)
Cardiology Office Note   Date:  09/01/2022   ID:  Edward Hines, DOB 05-20-1940, MRN [id number]  PCP:  Dois Davenport, MD  Cardiologist:   None Referring:  Dois Davenport, MD  Chief Complaint  Patient presents with   Shortness of Breath      History of Present Illness: Edward Hines is a 82 y.o. male who presents for evaluation of shortness of breath.  He is being followed by [facility] for a 48 mm aortic aneurysm.  Has not had any prior cardiac history.  He does have progressive shortness of breath.  He says he walks 30 yards to the mailbox and he will get dyspneic but again this is been slowly progressive.  He has morbid obesity is limited by back pain and leg pain.  He is pretty sedentary.  He sleeps a lot during the day but it turns out he has a positive sleep study and has not wanted to wear CPAP yet.  He is not describing PND or orthopnea.  He is not describing chest pressure, neck or arm discomfort.  He has a chronic lower extremity swelling.  I reviewed some outpatient primary care records and he did have an echocardiogram done that demonstrated well-preserved ejection fraction with 50 mm aorta.  There was some sclerosis of the aortic valve but no significant abnormalities otherwise.  He I do see his CT from June at [facility].  The aorta was 48 x 47 mm.  There was aortic atherosclerosis and coronary artery calcification.  He had a 4 mm lung nodule in the right lower lobe and a 5 mm nodule in the right major fissure.  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   Past Medical History:  Diagnosis Date   Arthritis    lumbar region   Hyperlipidemia    Hypertension    Sleep apnea    Currently no treatment    Past Surgical History:  Procedure Laterality Date   EYE SURGERY Bilateral    cataracts/w IOL-  implanted    HEMORROIDECTOMY  1964   KNEE CARTILAGE SURGERY Left 2005   repair of meniscus   LUMBAR LAMINECTOMY/DECOMPRESSION MICRODISCECTOMY N/A 09/14/2018   Procedure: LUMBAR  3- LUMBAR 5 DECOMPRESSION;  Surgeon: Estill Bamberg, MD;  Location: [facility] OR;  Service: Orthopedics;  Laterality: N/A;     Current Outpatient Medications  Medication Sig Dispense Refill   diltiazem (CARDIZEM CD) 360 MG 24 hr capsule Take 360 mg by mouth daily.     diphenhydramine-acetaminophen (TYLENOL PM) 25-500 MG TABS tablet Take 1 tablet by mouth at bedtime.     ergocalciferol (VITAMIN D2) 1.25 MG (50000 UT) capsule TAKE 1 CAPSULES BY MOUTH MONDAY AND THURSDAY WEEKLY X 4 WEEKLY SECONDARY TO VERY LOW VITAMIN D     ketoconazole (NIZORAL) 2 % shampoo SMARTSIG:5 Milliliter(s) Topical Once a Week     losartan (COZAAR) 25 MG tablet      meloxicam (MOBIC) 15 MG tablet Take 15 mg by mouth daily.     mupirocin ointment (BACTROBAN) 2 %      rosuvastatin (CRESTOR) 10 MG tablet Take 10 mg by mouth daily.     tamsulosin (FLOMAX) 0.4 MG CAPS capsule Take 0.4 mg by mouth every morning.     triamcinolone cream (KENALOG) 0.1 % Apply 1 application topically 2 (two) times daily.     DULoxetine (CYMBALTA) 20 MG capsule Take 20 mg by mouth 2 (two) times daily. (Patient not taking: Reported on 08/31/2022)  gabapentin (NEURONTIN) 100 MG capsule 1 CAP AT BEDTIME X 5 DAYS, THEN INCREASE TO 2 CAPS AT BEDTIME X 5 DAYS THEN INCREASE TO 3 CAPS (Patient not taking: Reported on 08/31/2022)     HYDROcodone-acetaminophen (NORCO) 10-325 MG tablet Take 1 tablet by mouth 3 (three) times daily as needed. (Patient not taking: Reported on 08/31/2022)     lisinopril-hydrochlorothiazide (ZESTORETIC) 20-25 MG tablet Take 1 tablet by mouth daily. (Patient not taking: Reported on 08/31/2022)     oxyCODONE-acetaminophen (PERCOCET/ROXICET) 5-325 MG tablet TAKE 1 TABLET BY MOUTH EVERY 4 (FOUR) HOURS AS NEEDED FOR UP TO 7 DAYS FOR SEVERE PAIN. (Patient not taking: Reported on 08/31/2022)     pravastatin (PRAVACHOL) 10 MG tablet Take by mouth. (Patient not taking: Reported on 08/31/2022)     pregabalin (LYRICA) 75 MG capsule  (Patient not  taking: Reported on 08/31/2022)     No current facility-administered medications for this visit.    Allergies:   Patient has no known allergies.    Social History:  The patient  reports that he has never smoked. His smokeless tobacco use includes snuff. He reports current alcohol use of about 10.0 standard drinks of alcohol per week.   Family History:  The patient's family history includes Hypertension in his mother; Stroke in his brother; Thyroid cancer in his child.    ROS:  Please see the history of present illness.   Otherwise, review of systems are positive for none.   All other systems are reviewed and negative.    PHYSICAL EXAM: VS:  BP (!) 102/58 (BP Location: Left Arm, Patient Position: Sitting, Cuff Size: Large)   Pulse 75   SpO2 95%  , BMI There is no height or weight on file to calculate BMI. GENERAL:  Well appearing HEENT:  Pupils equal round and reactive, fundi not visualized, oral mucosa unremarkable NECK:  No jugular venous distention, waveform within normal limits, carotid upstroke brisk and symmetric, no bruits, no thyromegaly LYMPHATICS:  No cervical, inguinal adenopathy LUNGS:  Clear to auscultation bilaterally BACK:  No CVA tenderness CHEST:  Unremarkable HEART:  PMI not displaced or sustained,S1 and S2 within normal limits, no S3, no S4, no clicks, no rubs, no murmurs ABD:  Flat, positive bowel sounds normal in frequency in pitch, no bruits, no rebound, no guarding, no midline pulsatile mass, no hepatomegaly, no splenomegaly EXT:  2 plus pulses throughout, moderate leg edema, no cyanosis no clubbing SKIN:  No rashes no nodules NEURO:  Cranial nerves II through XII grossly intact, motor grossly intact throughout [facility]:  Cognitively intact, oriented to person place and time    EKG:  EKG Interpretation Date/Time:  Tuesday August 31 2022 15:32:15 EDT Ventricular Rate:  71 PR Interval:  166 QRS Duration:  90 QT Interval:  408 QTC Calculation: 443 R  Axis:   67  Text Interpretation: Normal sinus rhythm Poor anterior R wave progression When compared with ECG of 05-Sep-2018 11:38, No significant change since last tracing Confirmed by Rollene Rotunda ([id number]) on 08/31/2022 4:00:54 PM     Recent Labs: No results found for requested labs within last 365 days.    Lipid Panel No results found for: "CHOL", "TRIG", "HDL", "CHOLHDL", "VLDL", "LDLCALC", "LDLDIRECT"    Wt Readings from Last 3 Encounters:  08/04/22 230 lb (104.3 kg)  09/14/18 221 lb 9.6 oz (100.5 kg)  09/05/18 221 lb 9.6 oz (100.5 kg)      Other studies Reviewed: Additional studies/ records that were reviewed today include: Outpatient  primary care records, CT and echocardiogram from outside hospital and [facility]. Review of the above records demonstrates:  Please see elsewhere in the note.     ASSESSMENT AND PLAN:  Aortic aneurysm: This will be followed in 6 months with a contrasted CT and I have ordered this.  HTN: Blood pressure is actually well-controlled.  Continue the meds as listed.  SOB:   This could be weight and deconditioning but he does have coronary calcium.  This could be an anginal equivalent.  And then send him for a [employer].  I will check a BNP  Lung nodules: These will be imaged at the time of his CT.  Sleep apnea: I think I convinced him to go back to his primary care doctor and see about CPAP.  Edema: He has not had evidence of heart failure pulmonary pressures were elevated.  I think this is probably related to his immobility, back problems, weight, salt intake.  He should have conservative management.  Obesity: We did talk about diet particularly lowering his high glycemic carbohydrates.   Current medicines are reviewed at length with the patient today.  The patient does not have concerns regarding medicines.  The following changes have been made:  no change  Labs/ tests ordered today include:   Orders Placed This Encounter  Procedures    Pro b natriuretic peptide (BNP)[id number])   Cardiac Stress Test: Informed Consent Details: Physician/Practitioner Attestation; Transcribe to consent form and obtain patient signature   MYOCARDIAL PERFUSION IMAGING   EKG 12-Lead     Disposition:   FU with me in six months.     Signed, Rollene Rotunda, MD  09/01/2022 9:11 AM    [facility]

## 2022-08-31 ENCOUNTER — Ambulatory Visit: Payer: Medicare Other | Attending: Cardiology | Admitting: Cardiology

## 2022-08-31 ENCOUNTER — Encounter: Payer: Self-pay | Admitting: Cardiology

## 2022-08-31 VITALS — BP 102/58 | HR 75

## 2022-08-31 DIAGNOSIS — R0602 Shortness of breath: Secondary | ICD-10-CM

## 2022-08-31 DIAGNOSIS — I10 Essential (primary) hypertension: Secondary | ICD-10-CM

## 2022-08-31 DIAGNOSIS — I712 Thoracic aortic aneurysm, without rupture, unspecified: Secondary | ICD-10-CM

## 2022-08-31 NOTE — Patient Instructions (Signed)
    Testing/Procedures:  Your physician has requested that you have a lexiscan myoview. For further information please visit [email or web address] Please follow instruction sheet, as given. [address]   Follow-Up: At [facility], you and your health needs are our priority.  As part of our continuing mission to provide you with exceptional heart care, we have created designated Provider Care Teams.  These Care Teams include your primary Cardiologist (physician) and Advanced Practice Providers (APPs -  Physician Assistants and Nurse Practitioners) who all work together to provide you with the care you need, when you need it.  We recommend signing up for the patient portal called "MyChart".  Sign up information is provided on this After Visit Summary.  MyChart is used to connect with patients for Virtual Visits (Telemedicine).  Patients are able to view lab/test results, encounter notes, upcoming appointments, etc.  Non-urgent messages can be sent to your provider as well.   To learn more about what you can do with MyChart, go to [email or web address].    Your next appointment:   6 month(s)  Provider:   Rollene Rotunda MD

## 2022-09-01 ENCOUNTER — Encounter: Payer: Self-pay | Admitting: Cardiology

## 2022-09-02 ENCOUNTER — Encounter: Payer: Self-pay | Admitting: *Deleted

## 2022-09-02 ENCOUNTER — Telehealth (HOSPITAL_COMMUNITY): Payer: Self-pay | Admitting: *Deleted

## 2022-09-02 NOTE — Telephone Encounter (Signed)

## 2022-09-06 ENCOUNTER — Ambulatory Visit (HOSPITAL_COMMUNITY): Payer: Medicare Other | Attending: Cardiology

## 2022-09-06 DIAGNOSIS — R0602 Shortness of breath: Secondary | ICD-10-CM | POA: Diagnosis present

## 2022-09-06 LAB — MYOCARDIAL PERFUSION IMAGING
LV dias vol: 70 mL (ref 62–150)
LV sys vol: 21 mL
Nuc Stress EF: 70 %
Peak HR: 80 {beats}/min
Rest HR: 67 {beats}/min
Rest Nuclear Isotope Dose: 10.9 mCi
SDS: 0
SRS: 0
SSS: 0
ST Depression (mm): 0 mm
Stress Nuclear Isotope Dose: 32.8 mCi
TID: 1.02

## 2022-09-06 MED ORDER — REGADENOSON 0.4 MG/5ML IV SOLN
0.4000 mg | Freq: Once | INTRAVENOUS | Status: AC
  Administered 2022-09-06: 0.4 mg via INTRAVENOUS

## 2022-09-06 MED ORDER — TECHNETIUM TC 99M TETROFOSMIN IV KIT
32.8000 | PACK | Freq: Once | INTRAVENOUS | Status: AC | PRN
  Administered 2022-09-06: 32.8 via INTRAVENOUS

## 2022-09-06 MED ORDER — TECHNETIUM TC 99M TETROFOSMIN IV KIT
10.9000 | PACK | Freq: Once | INTRAVENOUS | Status: AC | PRN
  Administered 2022-09-06: 10.9 via INTRAVENOUS

## 2022-09-10 ENCOUNTER — Encounter: Payer: Self-pay | Admitting: *Deleted

## 2022-09-13 ENCOUNTER — Telehealth: Payer: Self-pay | Admitting: Cardiology

## 2022-09-13 NOTE — Telephone Encounter (Signed)
Pt spouse calling in for stress test results

## 2022-09-13 NOTE — Telephone Encounter (Signed)
Called and gave results to both wife and husband, on phone together. Advised copy went to PCP and copy mailed.

## 2022-10-10 ENCOUNTER — Other Ambulatory Visit: Payer: Self-pay

## 2022-10-10 ENCOUNTER — Emergency Department (HOSPITAL_COMMUNITY): Payer: Medicare Other

## 2022-10-10 ENCOUNTER — Emergency Department (HOSPITAL_COMMUNITY)
Admission: EM | Admit: 2022-10-10 | Discharge: 2022-10-10 | Disposition: A | Discharge status: Home / Self Care | Payer: Medicare Other | Attending: Emergency Medicine | Admitting: Emergency Medicine

## 2022-10-10 DIAGNOSIS — S46911A Strain of unspecified muscle, fascia and tendon at shoulder and upper arm level, right arm, initial encounter: Secondary | ICD-10-CM | POA: Diagnosis not present

## 2022-10-10 DIAGNOSIS — I10 Essential (primary) hypertension: Secondary | ICD-10-CM | POA: Diagnosis not present

## 2022-10-10 DIAGNOSIS — Z79899 Other long term (current) drug therapy: Secondary | ICD-10-CM | POA: Insufficient documentation

## 2022-10-10 DIAGNOSIS — S0990XA Unspecified injury of head, initial encounter: Secondary | ICD-10-CM | POA: Insufficient documentation

## 2022-10-10 DIAGNOSIS — W19XXXA Unspecified fall, initial encounter: Secondary | ICD-10-CM | POA: Diagnosis not present

## 2022-10-10 DIAGNOSIS — M25511 Pain in right shoulder: Secondary | ICD-10-CM | POA: Diagnosis present

## 2022-10-10 NOTE — Discharge Instructions (Addendum)
Recommend taking Tylenol 1000mg  four times a day and ibuprofen 800mg  three times a day for pain control.  You can also use ice and heat.  These follow-up with primary care for todays findings.  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 It is recommended that you get a Brain MRI without and with contrast is suggested for further characterization.  Please return to the emergency room if you have any new or worsening symptoms.

## 2022-10-10 NOTE — ED Provider Notes (Signed)
[place] EMERGENCY DEPARTMENT AT [facility] Provider Note   CSN: [id number] Arrival date & time: 10/10/22  2841     History  Chief Complaint  Patient presents with   Shoulder Pain    Edward Hines is a 82 y.o. male HLD, HTN presenting for fall that occurred yesterday around 2 PM.  He fell directly onto the right side of his head and right shoulder after mechanical fall. Pt states he tripped on a chair while trying to stand up. Denies LOC, AMS, vomiting, amnesia. Patient is not on blood thinner. Patient reports that his right shoulder pain is most concerning.  He reports he difficulty moving his right shoulder secondary to pain.  Earlier today he took Norco that he was given from his surgery, which helped with his pain.  She has not on a blood thinner   Shoulder Pain      Home Medications Prior to Admission medications   Medication Sig Start Date End Date Taking? Authorizing Provider  diltiazem (CARDIZEM CD) 360 MG 24 hr capsule Take 360 mg by mouth daily.    [provider]  diphenhydramine-acetaminophen (TYLENOL PM) 25-500 MG TABS tablet Take 1 tablet by mouth at bedtime.    [provider]  DULoxetine (CYMBALTA) 20 MG capsule Take 20 mg by mouth 2 (two) times daily. Patient not taking: Reported on 08/31/2022 01/16/19   [provider]  ergocalciferol (VITAMIN D2) 1.25 MG (50000 UT) capsule TAKE 1 CAPSULES BY MOUTH MONDAY AND THURSDAY WEEKLY X 4 WEEKLY SECONDARY TO VERY LOW VITAMIN D 05/02/19   [provider]  gabapentin (NEURONTIN) 100 MG capsule 1 CAP AT BEDTIME X 5 DAYS, THEN INCREASE TO 2 CAPS AT BEDTIME X 5 DAYS THEN INCREASE TO 3 CAPS Patient not taking: Reported on 08/31/2022 05/22/18   [provider]  HYDROcodone-acetaminophen (NORCO) 10-325 MG tablet Take 1 tablet by mouth 3 (three) times daily as needed. Patient not taking: Reported on 08/31/2022 08/28/19   [provider]  ketoconazole (NIZORAL) 2 % shampoo  SMARTSIG:5 Milliliter(s) Topical Once a Week 08/20/19   [provider]  lisinopril-hydrochlorothiazide (ZESTORETIC) 20-25 MG tablet Take 1 tablet by mouth daily. Patient not taking: Reported on 08/31/2022    [provider]  losartan (COZAAR) 25 MG tablet  05/04/19   [provider]  meloxicam (MOBIC) 15 MG tablet Take 15 mg by mouth daily. 01/22/19   [provider]  mupirocin ointment (BACTROBAN) 2 %  11/27/18   [provider]  oxyCODONE-acetaminophen (PERCOCET/ROXICET) 5-325 MG tablet TAKE 1 TABLET BY MOUTH EVERY 4 (FOUR) HOURS AS NEEDED FOR UP TO 7 DAYS FOR SEVERE PAIN. Patient not taking: Reported on 08/31/2022 09/14/18   [provider]  pravastatin (PRAVACHOL) 10 MG tablet Take by mouth. Patient not taking: Reported on 08/31/2022    [provider]  pregabalin (LYRICA) 75 MG capsule  06/28/19   [provider]  rosuvastatin (CRESTOR) 10 MG tablet Take 10 mg by mouth daily. 08/01/18   [provider]  tamsulosin (FLOMAX) 0.4 MG CAPS capsule Take 0.4 mg by mouth every morning. 01/18/19   [provider]  triamcinolone cream (KENALOG) 0.1 % Apply 1 application topically 2 (two) times daily.    [provider]      Allergies    Patient has no known allergies.    Review of Systems   Review of Systems  Musculoskeletal:        Right shoulder pain  Physical Exam Updated Vital Signs BP 131/87 (BP Location: Right Arm)   Pulse 78   Temp 97.6 F (36.4 C) (Oral)   Resp 16   Ht 5\' 7"  (1.702 m)   Wt 104 kg   SpO2 99%   BMI 35.91 kg/m  Physical Exam Vitals and nursing note reviewed.  Constitutional:      General: He is not in acute distress.    Appearance: He is not toxic-appearing.  HENT:     Head: Normocephalic and atraumatic.  Eyes:     General: No scleral icterus.    Conjunctiva/sclera: Conjunctivae normal.  Cardiovascular:     Rate and Rhythm: Normal rate and regular rhythm.      Pulses: Normal pulses.     Heart sounds: Normal heart sounds.  Pulmonary:     Effort: Pulmonary effort is normal. No respiratory distress.     Breath sounds: Normal breath sounds. No wheezing.  Abdominal:     General: Abdomen is flat. Bowel sounds are normal. There is no distension.     Palpations: Abdomen is soft. There is no mass.     Tenderness: There is no abdominal tenderness.  Musculoskeletal:        General: No deformity.     Right lower leg: No edema.     Left lower leg: No edema.     Comments: Patient has sensation equal bilaterally, strength of right shoulder is decreased above 90 degrees of flexion, ROM of right shoulder decreased w/ flexion and abduction compared to the left shoulder. Strong radial pulse, neurovascularly intact.   Skin:    General: Skin is warm and dry.     Capillary Refill: Capillary refill takes less than 2 seconds.     Findings: No lesion.  Neurological:     General: No focal deficit present.     Mental Status: He is alert and oriented to person, place, and time. Mental status is at baseline.     Cranial Nerves: No cranial nerve deficit.     Motor: No weakness.     ED Results / Procedures / Treatments   Labs (all labs ordered are listed, but only abnormal results are displayed) Labs Reviewed - No data to display  EKG None  Radiology CT Head Wo Contrast  Result Date: 10/10/2022 CLINICAL DATA:  Fall yesterday. Moderate to severe blunt head trauma. EXAM: CT HEAD WITHOUT CONTRAST TECHNIQUE: Contiguous axial images were obtained from the base of the skull through the vertex without intravenous contrast. RADIATION DOSE REDUCTION: This exam was performed according to the departmental dose-optimization program which includes automated exposure control, adjustment of the mA and/or kV according to patient size and/or use of iterative reconstruction technique. COMPARISON:  None Available. FINDINGS: Brain: No evidence of intracranial hemorrhage, acute  infarction, hydrocephalus, or extra-axial fluid collection. Mild diffuse cerebral atrophy is noted. 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 A homogeneous extra-axial mass is seen in the right frontal region measuring 1.8 x 1.2 cm, most likely representing a meningioma. Vascular:  No hyperdense vessel or other acute findings. Skull: No evidence of fracture or other significant bone abnormality. Sinuses/Orbits:  No acute findings. Other: None IMPRESSION: No acute intracranial abnormality. 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 Brain MRI without and with contrast is suggested for further characterization. Mild cerebral atrophy and severe chronic small vessel disease. Electronically Signed   By: Danae Orleans M.D.   On: 10/10/2022 11:44   DG Shoulder Right  Result Date: 10/10/2022  CLINICAL DATA:  Fall yesterday.  Right shoulder injury and pain. EXAM: RIGHT SHOULDER - 2+ VIEW COMPARISON:  None Available. FINDINGS: There is no evidence of fracture or dislocation. Degenerative spurring seen involving the acromioclavicular joint. No other osseous abnormality identified. IMPRESSION: No acute findings. Mild acromioclavicular degenerative changes. Electronically Signed   By: Danae Orleans M.D.   On: 10/10/2022 11:39    Procedures Procedures    Medications Ordered in ED Medications - No data to display  ED Course/ Medical Decision Making/ A&P                                 Medical Decision Making Amount and/or Complexity of Data Reviewed Radiology: ordered.   ALTO GANDOLFO 82 y.o. presented today for HA and right shoulder pain. Working Ddx: tension headache, migraine, intracranial mass, intracranial hemorrhage, intracranial infection including meningitis vs encephalitis, trigeminal neuralgia, AVM, sinusitis, cerebral aneurysm, muscular headache, cavernous sinus thrombosis, carotid artery dissection, fracture, dislocation, muscle strain    PMHX: Hypertension  hyperlipidemia  Review of prior external notes: None  Unique Tests and My Interpretation:   CT Head w/o Contrast: 1.8 cm frontal mass likely representing meningioma.  Radiology recommends follow-up with MRI of the head with and without contrast okay to do in outpatient setting. Right shoulder x-ray showing no fracture, dislocation, acute pathology  Problem List / ED Course / Critical interventions / Medication management  Patient reporting to emergency room with right shoulder pain and head pain.  Given his age head CT was performed which did not show any acute pathology.  Patient will follow-up for MRI outpatient given recommendations for follow-up for possible meningioma.  Right shoulder x-ray was normal here today no acute pathology.  Given that patient's pain is controlled and physical exam findings I will have him follow-up outpatient for right shoulder pain.  No concern for septic joint, cellulitis, facial fractures, patient.  His neurological exam was normal, focal deficits and neurovascularly intact.  Patient will alternate Tylenol and ibuprofen for pain control at home. I did not order medication for this patient as he already took something prior to arrival which alleviated his pain Reevaluation of the patient after these medicines showed that the patient improved Patients vitals assessed. Upon arrival patient is hemodynamically stable.  I have reviewed the patients home medicines and have made adjustments as needed     Plan:  Patient's family did not want to send anything for pain control they will use over-the-counter medications. F/u w/ PCP in 2-3d to ensure resolution of sx.  Patient was given return precautions. Patient stable for discharge at this time.  Patient educated on sx/dx and verbalized understanding of plan. Return to ED if new or worsening sx.           Final Clinical Impression(s) / ED Diagnoses Final diagnoses:  None    Rx / DC Orders ED Discharge  Orders     None         Raford Pitcher Evalee Jefferson 10/11/22 0728    Lorre Nick, MD 10/16/22 [phone]

## 2022-10-10 NOTE — ED Triage Notes (Signed)
Pt reports mechanical fall yesterday. C/o right shoulder pain, abrasion noted to right forehead. Denies loc w/fall.

## 2022-10-10 NOTE — ED Provider Notes (Signed)
I provided a substantive portion of the care of this patient.  I personally made/approved the management plan for this patient and take responsibility for the patient management.       82 year old male who presents after mechanical fall yesterday.  Larey Seat to his right shoulder and head.  Right shoulder x-ray negative.  Head CT showed ligament tumor.  Patient given instructions about follow-up for MRI as an outpatient   Lorre Nick, MD 10/10/22 1229

## 2022-10-18 ENCOUNTER — Ambulatory Visit: Payer: Medicare Other | Admitting: Podiatry

## 2022-10-28 ENCOUNTER — Encounter: Payer: Self-pay | Admitting: Podiatry

## 2022-10-28 ENCOUNTER — Ambulatory Visit: Payer: Medicare Other | Admitting: Podiatry

## 2022-10-28 DIAGNOSIS — M79675 Pain in left toe(s): Secondary | ICD-10-CM | POA: Diagnosis not present

## 2022-10-28 DIAGNOSIS — M79674 Pain in right toe(s): Secondary | ICD-10-CM

## 2022-10-28 DIAGNOSIS — B351 Tinea unguium: Secondary | ICD-10-CM | POA: Diagnosis not present

## 2022-10-29 NOTE — Progress Notes (Signed)
Subjective:   Patient ID: Edward Hines, male   DOB: 82 y.o.   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

## 2022-12-28 ENCOUNTER — Other Ambulatory Visit: Payer: Self-pay | Admitting: Surgery

## 2022-12-28 DIAGNOSIS — I7121 Aneurysm of the ascending aorta, without rupture: Secondary | ICD-10-CM

## 2023-01-28 ENCOUNTER — Encounter: Payer: Self-pay | Admitting: Podiatry

## 2023-01-28 ENCOUNTER — Ambulatory Visit: Payer: Medicare Other | Admitting: Podiatry

## 2023-01-28 DIAGNOSIS — M79674 Pain in right toe(s): Secondary | ICD-10-CM | POA: Diagnosis not present

## 2023-01-28 DIAGNOSIS — M79675 Pain in left toe(s): Secondary | ICD-10-CM | POA: Diagnosis not present

## 2023-01-28 DIAGNOSIS — B351 Tinea unguium: Secondary | ICD-10-CM

## 2023-01-31 NOTE — Progress Notes (Signed)
 Subjective:   Patient ID: Edward Hines, male   DOB: 83 y.o.   MRN: [id number]   HPI Patient presents with elongated nailbeds 1-5 both feet thick and painful   ROS      Objective:  Physical Exam  Neurovascular status intact with thick yellow brittle nailbeds 1-5 both feet painful     Assessment:  Chronic mycotic nail infection with pain 1-5 both feet     Plan:  Debride nailbeds 1-5 both feet no iatrogenic bleeding reappoint routine care

## 2023-02-02 ENCOUNTER — Ambulatory Visit
Admission: RE | Admit: 2023-02-02 | Discharge: 2023-02-02 | Disposition: A | Discharge status: Home / Self Care | Payer: Medicare Other | Source: Ambulatory Visit | Attending: Surgery | Admitting: Surgery

## 2023-02-02 DIAGNOSIS — I7121 Aneurysm of the ascending aorta, without rupture: Secondary | ICD-10-CM

## 2023-02-02 MED ORDER — IOPAMIDOL (ISOVUE-370) INJECTION 76%
500.0000 mL | Freq: Once | INTRAVENOUS | Status: AC | PRN
  Administered 2023-02-02: 75 mL via INTRAVENOUS

## 2023-02-09 ENCOUNTER — Ambulatory Visit: Payer: Medicare Other | Admitting: Surgery

## 2023-02-09 ENCOUNTER — Encounter: Payer: Self-pay | Admitting: Surgery

## 2023-02-09 VITALS — BP 135/86 | HR 78 | Resp 18 | Ht 67.0 in

## 2023-02-09 DIAGNOSIS — I7121 Aneurysm of the ascending aorta, without rupture: Secondary | ICD-10-CM | POA: Diagnosis not present

## 2023-02-09 NOTE — Progress Notes (Signed)
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  He returns to see me for 37-month follow-up.  He reports feeling well overall.  He denies any chest pain or pressure.  No shortness of breath.  He is a non-smoker.  His wife is with him today.  Current Outpatient Medications  Medication Sig Dispense Refill   diltiazem (CARDIZEM CD) 360 MG 24 hr capsule Take 360 mg by mouth daily.     diphenhydramine-acetaminophen (TYLENOL PM) 25-500 MG TABS tablet Take 1 tablet by mouth at bedtime.     ergocalciferol (VITAMIN D2) 1.25 MG (50000 UT) capsule TAKE 1 CAPSULES BY MOUTH MONDAY AND THURSDAY WEEKLY X 4 WEEKLY SECONDARY TO VERY LOW VITAMIN D     ketoconazole (NIZORAL) 2 % shampoo SMARTSIG:5 Milliliter(s) Topical Once a Week     losartan (COZAAR) 25 MG tablet      meloxicam (MOBIC) 15 MG tablet Take 15 mg by mouth daily.     rosuvastatin (CRESTOR) 10 MG tablet Take 10 mg by mouth daily.     tamsulosin (FLOMAX) 0.4 MG CAPS capsule Take 0.4 mg by mouth every morning.     triamcinolone cream (KENALOG) 0.1 % Apply 1 application topically 2 (two) times daily.     No current facility-administered medications for this visit.     Physical Exam: BP 135/86   Pulse 78   Resp 18   Ht 5\' 7"  (1.702 m)   SpO2 97% Comment: RA  BMI 35.91 kg/m  He looks well. Cardiac exam shows regular rate and rhythm with normal heart sounds.  There is no murmur. Lungs are clear. There is no peripheral edema.  Diagnostic Tests:  Narrative & Impression  CLINICAL DATA:  Hypertension.  Aortic aneurysm.   EXAM: CT ANGIOGRAPHY CHEST WITH CONTRAST   TECHNIQUE: Multidetector CT imaging of the chest was performed using the standard protocol during bolus administration of intravenous contrast. Multiplanar CT image reconstructions and MIPs were obtained to evaluate the vascular anatomy.   RADIATION DOSE REDUCTION: This exam was  performed according to the departmental dose-optimization program which includes automated exposure control, adjustment of the mA and/or kV according to patient size and/or use of iterative reconstruction technique.   CONTRAST:  75mL ISOVUE-370 IOPAMIDOL (ISOVUE-370) INJECTION 76%   COMPARISON:  None Available.   FINDINGS: Cardiovascular: Satisfactory opacification of the thoracic aorta. Mid ascending thoracic aorta measures 4.7 x 4.7 cm in diameter. There is some tortuosity of the descending thoracic aorta. No aortic dissection. Common origin of the brachiocephalic and left common carotid arteries, a normal variant. Widely patent arch vessels. Scattered atherosclerotic vascular calcifications of the aorta and coronary arteries. Central pulmonary vasculature is also well opacified. No central filling defects. Heart size within normal limits. No pericardial effusion.   Mediastinum/Nodes: No enlarged mediastinal, hilar, or axillary lymph nodes. Thyroid gland, trachea, and esophagus demonstrate no significant findings.   Lungs/Pleura: Scarring or atelectasis within the right middle lobe along the major fissure. Lungs are otherwise clear. No pleural effusion or pneumothorax.   Upper Abdomen: Hepatic steatosis. Thickening of the bilateral adrenal glands. No acute abnormality.   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 No acute osseous abnormality and no chest wall abnormality.   Review of the MIP images confirms the above findings.   IMPRESSION: 1. Ascending thoracic aortic aneurysm measuring 4.7 cm  in diameter. Ascending thoracic aortic aneurysm. Recommend semi-annual imaging followup by CTA or MRA and referral to cardiothoracic surgery if not already obtained. This recommendation follows 2010 ACCF/AHA/AATS/ACR/ASA/SCA/SCAI/SIR/STS/SVM Guidelines for  the Diagnosis and Management of Patients With Thoracic Aortic Disease. Circulation. 2010; 121: Z610-R604. Aortic aneurysm NOS (ICD10-I71.9) 2. Hepatic steatosis. 3. Aortic and coronary artery atherosclerosis (ICD10-I70.0).     Electronically Signed   By: Duanne Guess D.O.   On: 02/02/2023 16:22      Impression:  This 83 year old gentleman has a stable 4.7 cm fusiform ascending aortic aneurysm.  2D echocardiogram in June 2024 showed a trileaflet aortic valve with mild sclerosis and calcification but no evidence of stenosis or insufficiency.  Left ventricular function was normal.  His aneurysm is still well below the surgical threshold of 5.5 cm.  I reviewed the CT images with him and his wife and answered all of their questions.  I stressed the importance of continued good blood pressure control in preventing further enlargement and acute aortic dissection.  I advised him against doing any heavy lifting that may require a Valsalva maneuver and could suddenly raise his blood pressure to high levels.   Plan:  He would like to wait for 2 years to repeat his CTA of the chest given his advanced age and the stability of the aneurysm.  I think that is reasonable.  I will plan to see him back in 2 years with a CTA of the chest.  I spent 15 minutes performing this established patient evaluation and > 50% of this time was spent face to face counseling and coordinating the care of this patient's aortic aneurysm.   Alleen Borne, MD [facility] [phone]

## 2023-03-08 LAB — LAB REPORT - SCANNED: A1c: 6.5

## 2023-03-20 NOTE — Progress Notes (Deleted)
  Cardiology Office Note:   Date:  03/20/2023  ID:  ESDRAS DELAIR, DOB 07/04/1940, MRN [id number] PCP: Dois Davenport, MD  [facility] Providers Cardiologist:  None {  History of Present Illness:   CLAUDELL WOHLER is a 83 y.o. male ***  who presents for evaluation of shortness of breath.  He is being followed by [facility] for a 48 mm aortic aneurysm.  Has not had any prior cardiac history.  He does have progressive shortness of breath.  He says he walks 30 yards to the mailbox and he will get dyspneic but again this is been slowly progressive.  He has morbid obesity is limited by back pain and leg pain.  He is pretty sedentary.  He sleeps a lot during the day but it turns out he has a positive sleep study and has not wanted to wear CPAP yet.  He is not describing PND or orthopnea.  He is not describing chest pressure, neck or arm discomfort.  He has a chronic lower extremity swelling.   I reviewed some outpatient primary care records and he did have an echocardiogram done that demonstrated well-preserved ejection fraction with 50 mm aorta.  There was some sclerosis of the aortic valve but no significant abnormalities otherwise.  He I do see his CT from June at [place].  The aorta was 48 x 47 mm.  There was aortic atherosclerosis and coronary artery calcification.  He had a 4 mm lung nodule in the right lower lobe and a 5 mm nodule in the right major fissure.  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   ROS:    EKG:       ***  Risk Assessment/Calculations:   {Does this patient have ATRIAL FIBRILLATION?:[phone]} No BP recorded.  {Refresh Note OR Click here to enter BP  :1}***        Physical Exam:   VS:  There were no vitals taken for this visit.   Wt Readings from Last 3 Encounters:  10/10/22 229 lb 4.5 oz (104 kg)  09/06/22 230 lb (104.3 kg)  08/04/22 230 lb (104.3 kg)     GEN: Well nourished, well developed in no acute distress NECK: No JVD; No carotid bruits CARDIAC:  ***RR, *** murmurs, rubs, gallops RESPIRATORY:  Clear to auscultation without rales, wheezing or rhonchi  ABDOMEN: Soft, non-tender, non-distended EXTREMITIES:  No edema; No deformity   ASSESSMENT AND PLAN:   ***     Follow up ***  Signed, Rollene Rotunda, MD

## 2023-03-22 ENCOUNTER — Ambulatory Visit: Payer: Medicare Other | Admitting: Cardiology

## 2023-04-28 ENCOUNTER — Encounter: Payer: Self-pay | Admitting: Podiatry

## 2023-04-28 ENCOUNTER — Ambulatory Visit: Payer: Medicare Other | Admitting: Podiatry

## 2023-04-28 DIAGNOSIS — M79675 Pain in left toe(s): Secondary | ICD-10-CM | POA: Diagnosis not present

## 2023-04-28 DIAGNOSIS — B351 Tinea unguium: Secondary | ICD-10-CM

## 2023-04-28 DIAGNOSIS — M79674 Pain in right toe(s): Secondary | ICD-10-CM | POA: Diagnosis not present

## 2023-04-29 NOTE — Progress Notes (Signed)
 Subjective:   Patient ID: Edward Hines, male   DOB: 83 y.o.   MRN: [id number]   HPI Patient presents stating nails of elongated they are thick and they are painful and he cannot cut   ROS      Objective:  Physical Exam  Neurovascular status intact with thick yellow brittle nailbeds 1-5 both feet painful when pressed      Assessment:  Chronic painful mycotic nail that patient cannot cut himself     Plan:  Debridement painful nailbeds 1-5 both feet no iatrogenic bleeding reappoint routine care

## 2023-05-18 DIAGNOSIS — R0602 Shortness of breath: Secondary | ICD-10-CM | POA: Insufficient documentation

## 2023-05-18 DIAGNOSIS — R0609 Other forms of dyspnea: Secondary | ICD-10-CM | POA: Insufficient documentation

## 2023-05-18 NOTE — Progress Notes (Signed)
  Cardiology Office Note:   Date:  05/19/2023  ID:  Edward Hines, DOB 1940/01/31, MRN [id number] PCP: Allene Ivan, MD  [facility] Providers Cardiologist:  None {  History of Present Illness:   Edward Hines is a 83 y.o. male who presents for evaluation of shortness of breath.  He is being followed by [facility] for a 47 mm aortic aneurysm.   He had SOB when I saw him last and he had a negative perfusion study.  Since I last saw him he has had no new cardiovascular complaints.  The patient denies any new symptoms such as chest discomfort, neck or arm discomfort. There has been no new shortness of breath, PND or orthopnea. There have been no reported palpitations, presyncope or syncope.  He does a little activity but he is relatively sedentary.  ROS: As stated in the HPI and negative for all other systems.  Studies Reviewed:    EKG:   EKG Interpretation Date/Time:  Thursday May 19 2023 16:40:17 EDT Ventricular Rate:  82 PR Interval:  190 QRS Duration:  90 QT Interval:  374 QTC Calculation: 436 R Axis:   9  Text Interpretation: Normal sinus rhythm Nonspecific T wave abnormality When compared with ECG of 31-Aug-2022 15:32, No significant change since last tracing Confirmed by Eilleen Grates ([id number]) on 05/19/2023 4:55:51 PM     Risk Assessment/Calculations:              Physical Exam:   VS:  BP 114/70   Pulse 80   Ht 5\' 7"  (1.702 m)   Wt 221 lb (100.2 kg)   SpO2 93%   BMI 34.61 kg/m    Wt Readings from Last 3 Encounters:  05/19/23 221 lb (100.2 kg)  10/10/22 229 lb 4.5 oz (104 kg)  09/06/22 230 lb (104.3 kg)     GEN: Well nourished, well developed in no acute distress NECK: No JVD; No carotid bruits CARDIAC: RRR, no murmurs, rubs, gallops RESPIRATORY:  Clear to auscultation without rales, wheezing or rhonchi  ABDOMEN: Soft, non-tender, non-distended EXTREMITIES: Mild leg edema; No deformity   ASSESSMENT AND PLAN:   Aortic aneurysm:  CT demonstrated 47 mm  ascending aneurysm.   He was seen by Dr. Sherene Dilling.   He is to have follow-up again in 2 years   HTN: Blood pressure is at target.  No change in therapy.   SOB: This seems to be baseline.  No change in therapy.  Lung nodules: There was no mention of this on the most recent CT.  No further imaging.   Sleep apnea:   He has not wanted to use CPAP.  No change in therapy.   Edema: This is mild and chronic.  No change in therapy.  Obesity:   He did lose a little weight as he took some diet advice.  I encouraged more of the same.   Follow up with me in 1 year  Signed, Eilleen Grates, MD

## 2023-05-19 ENCOUNTER — Encounter: Payer: Self-pay | Admitting: Cardiology

## 2023-05-19 ENCOUNTER — Ambulatory Visit: Attending: Cardiology | Admitting: Cardiology

## 2023-05-19 VITALS — BP 114/70 | HR 80 | Ht 67.0 in | Wt 221.0 lb

## 2023-05-19 DIAGNOSIS — M7989 Other specified soft tissue disorders: Secondary | ICD-10-CM

## 2023-05-19 DIAGNOSIS — I10 Essential (primary) hypertension: Secondary | ICD-10-CM | POA: Diagnosis not present

## 2023-05-19 DIAGNOSIS — I7121 Aneurysm of the ascending aorta, without rupture: Secondary | ICD-10-CM

## 2023-05-19 DIAGNOSIS — R0602 Shortness of breath: Secondary | ICD-10-CM

## 2023-05-19 NOTE — Patient Instructions (Signed)
Medication Instructions:  °Your physician recommends that you continue on your current medications as directed. Please refer to the Current Medication list given to you today.  °*If you need a refill on your cardiac medications before your next appointment, please call your pharmacy* ° ° °Lab Work: °None °If you have labs (blood work) drawn today and your tests are completely normal, you will receive your results only by: °MyChart Message (if you have MyChart) OR °A paper copy in the mail °If you have any lab test that is abnormal or we need to change your treatment, we will call you to review the results. ° ° °Testing/Procedures: °None ° ° °Follow-Up: °At [facility], you and your health needs are our priority.  As part of our continuing mission to provide you with exceptional heart care, we have created designated Provider Care Teams.  These Care Teams include your primary Cardiologist (physician) and Advanced Practice Providers (APPs -  Physician Assistants and Nurse Practitioners) who all work together to provide you with the care you need, when you need it. ° °We recommend signing up for the patient portal called "MyChart".  Sign up information is provided on this After Visit Summary.  MyChart is used to connect with patients for Virtual Visits (Telemedicine).  Patients are able to view lab/test results, encounter notes, upcoming appointments, etc.  Non-urgent messages can be sent to your provider as well.   °To learn more about what you can do with MyChart, go to [email or web address].   ° °Your next appointment:   °1 year(s) ° °The format for your next appointment:   °In Person ° °Provider:   °James Hochrein, MD   ° ° °Other Instructions °   °

## 2023-08-04 ENCOUNTER — Ambulatory Visit: Admitting: Podiatry

## 2023-08-04 ENCOUNTER — Encounter: Payer: Self-pay | Admitting: Podiatry

## 2023-08-04 DIAGNOSIS — M79675 Pain in left toe(s): Secondary | ICD-10-CM | POA: Diagnosis not present

## 2023-08-04 DIAGNOSIS — B351 Tinea unguium: Secondary | ICD-10-CM | POA: Diagnosis not present

## 2023-08-04 DIAGNOSIS — M79674 Pain in right toe(s): Secondary | ICD-10-CM | POA: Diagnosis not present

## 2023-08-04 NOTE — Progress Notes (Signed)
This patient returns to the office for evaluation and treatment of long thick painful nails .  This patient is unable to trim his own nails since the patient cannot reach the feet.  Patient says the nails are painful walking and wearing his shoes.  He returns for preventive foot care services.  General Appearance  Alert, conversant and in no acute stress.  Vascular  Dorsalis pedis and posterior tibial  pulses are palpable  bilaterally.  Capillary return is within normal limits  bilaterally. Temperature is within normal limits  bilaterally.  Neurologic  Senn-Weinstein monofilament wire test within normal limits  bilaterally. Muscle power within normal limits bilaterally.  Nails Thick disfigured discolored nails with subungual debris  from hallux to fifth toes bilaterally. No evidence of bacterial infection or drainage bilaterally.  Orthopedic  No limitations of motion  feet .  No crepitus or effusions noted.  No bony pathology or digital deformities noted.  Skin  normotropic skin with no porokeratosis noted bilaterally.  No signs of infections or ulcers noted.     Onychomycosis  Pain in toes right foot  Pain in toes left foot  Debridement  of nails  1-5  B/L with a nail nipper.  Nails were then filed using a dremel tool with no incidents.    RTC 3 months    Argie Lober DPM  

## 2023-08-25 ENCOUNTER — Other Ambulatory Visit: Payer: Self-pay | Admitting: Gastroenterology

## 2023-08-25 ENCOUNTER — Ambulatory Visit
Admission: RE | Admit: 2023-08-25 | Discharge: 2023-08-25 | Disposition: A | Discharge status: Home / Self Care | Source: Ambulatory Visit | Attending: Gastroenterology | Admitting: Gastroenterology

## 2023-08-25 DIAGNOSIS — R051 Acute cough: Secondary | ICD-10-CM

## 2023-08-26 ENCOUNTER — Other Ambulatory Visit (HOSPITAL_COMMUNITY): Payer: Self-pay | Admitting: *Deleted

## 2023-08-26 DIAGNOSIS — R1310 Dysphagia, unspecified: Secondary | ICD-10-CM

## 2023-08-26 NOTE — Telephone Encounter (Signed)
 Called and scheduled appointment.

## 2023-08-29 NOTE — Telephone Encounter (Signed)
 Wife contacted office and would like to reschedule 11/29/23 appointment (3 month). States would be available the following week, except for the 17th. Next available February. Please contact to advise.   Inquiring if finasteride is being added to medications or is taking the place of tamusolin.   Call back: [phone]

## 2023-08-30 NOTE — Telephone Encounter (Addendum)
 Patient's wife called inquiring if finasteride is being added to medications or is taking the place of tamusolin.    Call back: [phone]

## 2023-08-30 NOTE — Telephone Encounter (Signed)
 Patient states he will keep appt scheduled on 10/31/23.

## 2023-09-12 ENCOUNTER — Ambulatory Visit (HOSPITAL_COMMUNITY)
Admission: RE | Admit: 2023-09-12 | Discharge: 2023-09-12 | Disposition: A | Discharge status: Home / Self Care | Source: Ambulatory Visit | Attending: Family Medicine | Admitting: Family Medicine

## 2023-09-12 DIAGNOSIS — M1990 Unspecified osteoarthritis, unspecified site: Secondary | ICD-10-CM | POA: Insufficient documentation

## 2023-09-12 DIAGNOSIS — R1312 Dysphagia, oropharyngeal phase: Secondary | ICD-10-CM | POA: Insufficient documentation

## 2023-09-12 DIAGNOSIS — I10 Essential (primary) hypertension: Secondary | ICD-10-CM | POA: Insufficient documentation

## 2023-09-12 DIAGNOSIS — R490 Dysphonia: Secondary | ICD-10-CM | POA: Insufficient documentation

## 2023-09-12 DIAGNOSIS — R1310 Dysphagia, unspecified: Secondary | ICD-10-CM

## 2023-09-12 DIAGNOSIS — R059 Cough, unspecified: Secondary | ICD-10-CM | POA: Diagnosis present

## 2023-09-12 DIAGNOSIS — E785 Hyperlipidemia, unspecified: Secondary | ICD-10-CM | POA: Diagnosis not present

## 2023-09-12 NOTE — Addendum Note (Signed)
 Encounter addended by: Bonnie Norleen DASEN, CCC-SLP on: 09/12/2023 2:56 PM  Actions taken: Cosign clinical note with attestation

## 2023-09-12 NOTE — Therapy (Signed)
 Modified Barium Swallow Study  Patient Details  Name: Edward Hines MRN: [id number] Date of Birth: 07-17-1940  Today's Date: 09/12/2023  Modified Barium Swallow completed.  Full report located under Chart Review in the Imaging Section.  History of Present Illness Edward Hines is an 83 y.o. male referred by Dr. Rollin with GI. On 7/18, a laryngoscopy was completed by ENT to evaluate the patient's hoarseness. The laryngoscopy revealed no concerning findings. 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 On 8/8, A modified barrium study was ordered to rule out aspiration secondary to Edward Hines recently recoving from pneumonia.   Clinical Impression Patient exhibits mild oropharyngeal dysphagia. The two components from the MBSImP protocal that were not normal were the patient's initiation of Pharyngeal swallow which occured in the pyriforms as well as a delayed initiation of tongue motion. Penetration was above the vocal folds during large bolus of thin liquids as well as with the barrium tablet taken with thin liquids. Penetration was fully exhited the laryngeal vestibule. No aspiration observed. Patient briefly educated on recommendation to not take large volume sips of liquids.  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

## 2023-09-29 ENCOUNTER — Ambulatory Visit
Admission: RE | Admit: 2023-09-29 | Discharge: 2023-09-29 | Disposition: A | Discharge status: Home / Self Care | Source: Ambulatory Visit | Attending: Family Medicine | Admitting: Family Medicine

## 2023-09-29 ENCOUNTER — Other Ambulatory Visit: Payer: Self-pay | Admitting: Family Medicine

## 2023-09-29 DIAGNOSIS — Z8701 Personal history of pneumonia (recurrent): Secondary | ICD-10-CM

## 2023-10-03 ENCOUNTER — Other Ambulatory Visit: Payer: Self-pay | Admitting: Family Medicine

## 2023-10-03 DIAGNOSIS — R918 Other nonspecific abnormal finding of lung field: Secondary | ICD-10-CM

## 2023-10-07 ENCOUNTER — Ambulatory Visit
Admission: RE | Admit: 2023-10-07 | Discharge: 2023-10-07 | Disposition: A | Discharge status: Home / Self Care | Source: Ambulatory Visit | Attending: Family Medicine | Admitting: Family Medicine

## 2023-10-07 DIAGNOSIS — R918 Other nonspecific abnormal finding of lung field: Secondary | ICD-10-CM

## 2023-10-07 MED ORDER — IOPAMIDOL (ISOVUE-300) INJECTION 61%
75.0000 mL | Freq: Once | INTRAVENOUS | Status: AC | PRN
Start: 2023-10-07 — End: 2023-10-07
  Administered 2023-10-07: 75 mL via INTRAVENOUS

## 2023-11-16 ENCOUNTER — Ambulatory Visit: Admitting: Internal Medicine

## 2023-11-16 ENCOUNTER — Encounter: Payer: Self-pay | Admitting: Internal Medicine

## 2023-11-16 VITALS — BP 129/83 | HR 79 | Temp 97.7°F | Ht 67.0 in | Wt 240.0 lb

## 2023-11-16 DIAGNOSIS — F17210 Nicotine dependence, cigarettes, uncomplicated: Secondary | ICD-10-CM | POA: Diagnosis not present

## 2023-11-16 DIAGNOSIS — J479 Bronchiectasis, uncomplicated: Secondary | ICD-10-CM | POA: Insufficient documentation

## 2023-11-16 DIAGNOSIS — R0609 Other forms of dyspnea: Secondary | ICD-10-CM

## 2023-11-16 DIAGNOSIS — R0602 Shortness of breath: Secondary | ICD-10-CM

## 2023-11-16 LAB — CBC WITH DIFFERENTIAL/PLATELET
Basophils Absolute: 0.1 K/uL (ref 0.0–0.1)
Basophils Relative: 1.2 % (ref 0.0–3.0)
Eosinophils Absolute: 0.2 K/uL (ref 0.0–0.7)
Eosinophils Relative: 2.6 % (ref 0.0–5.0)
HCT: 44.7 % (ref 39.0–52.0)
Hemoglobin: 15.1 g/dL (ref 13.0–17.0)
Lymphocytes Relative: 22.4 % (ref 12.0–46.0)
Lymphs Abs: 1.6 K/uL (ref 0.7–4.0)
MCHC: 33.8 g/dL (ref 30.0–36.0)
MCV: 92.2 fl (ref 78.0–100.0)
Monocytes Absolute: 0.7 K/uL (ref 0.1–1.0)
Monocytes Relative: 9.9 % (ref 3.0–12.0)
Neutro Abs: 4.4 K/uL (ref 1.4–7.7)
Neutrophils Relative %: 63.9 % (ref 43.0–77.0)
Platelets: 229 K/uL (ref 150.0–400.0)
RBC: 4.84 Mil/uL (ref 4.22–5.81)
RDW: 15.5 % (ref 11.5–15.5)
WBC: 6.9 K/uL (ref 4.0–10.5)

## 2023-11-16 LAB — SEDIMENTATION RATE: Sed Rate: 19 mm/h (ref 0–20)

## 2023-11-16 MED ORDER — FAMOTIDINE 20 MG PO TABS: ORAL_TABLET | ORAL | 11 refills | Status: AC

## 2023-11-16 MED ORDER — PANTOPRAZOLE SODIUM 40 MG PO TBEC: 40.0000 mg | DELAYED_RELEASE_TABLET | Freq: Every day | ORAL | 2 refills | Status: AC

## 2023-11-16 MED ORDER — BENZONATATE 200 MG PO CAPS: 200.0000 mg | ORAL_CAPSULE | Freq: Three times a day (TID) | ORAL | 1 refills | Status: AC | PRN

## 2023-11-16 NOTE — Assessment & Plan Note (Addendum)
 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        Return in 4 weeks for cxr and with all meds in hand using a trust but verify approach to confirm accurate Medication  Reconciliation The principal here is that until we are certain that the  patients are doing what we've asked, it makes no sense to ask them to do more.   Each maintenance medication was reviewed in detail including emphasizing most importantly the difference between maintenance and prns and under what circumstances the prns are to be triggered using an action plan format where appropriate.  Total time for H and P, chart review, counseling, reviewing flutter device  device(s) and generating customized AVS unique to this office visit / same day charting = 48 min new pt eval

## 2023-11-16 NOTE — Progress Notes (Signed)
 Edward Hines, male    DOB: [birth year]   MRN: [id number]   Brief patient profile:  60 yowm  minimal smoker in [employer] with asbestos exp on [employer]  referred to pulmonary clinic 11/16/2023 by Edward Hines  for cough since mid July 2025  R Middle Lobe > never 100%  and then worse symbicort and    Present residence x 5 y  with 2 cats in bedroom   Pt not previously seen by [facility] service.    History of Present Illness  11/16/2023  Pulmonary/ 1st office eval/Edward Hines  Chief Complaint  Patient presents with   Consult    Referred by Dr. Darice Hines- pt dx with PNA x 2 over the past 3 months. He has some non prod cough.   Dyspnea:   Not limited by breathing from desired activities   Cough: awful honking sound but no production  Sleep: bed is flat / one pillow and s resp cc  SABA use: none  02 ldz:wnwz    No obvious day to day or daytime pattern/variability or assoc excess/ purulent sputum or mucus plugs or hemoptysis or cp or chest tightness, subjective wheeze or overt sinus or hb symptoms.    Also denies any obvious fluctuation of symptoms with weather or environmental changes or other aggravating or alleviating factors except as outlined above   No unusual exposure hx or h/o childhood pna/ asthma or knowledge of premature birth.  Current Allergies, Complete Past Medical History, Past Surgical History, Family History, and Social History were reviewed in [employer] record.  ROS  The following are not active complaints unless bolded Hoarseness, sore throat, dysphagia, dental problems, itching, sneezing,  nasal congestion or discharge of excess mucus or purulent secretions, ear ache,   fever, chills, sweats, unintended wt loss or wt gain, classically pleuritic or exertional cp,  orthopnea pnd or arm/hand swelling  or leg swelling, presyncope, palpitations, abdominal pain, anorexia, nausea, vomiting, diarrhea  or change in bowel habits or change in bladder habits,  change in stools or change in urine, dysuria, hematuria,  rash, arthralgias, visual complaints, headache, numbness, weakness or ataxia or problems with walking or coordination,  change in mood or  memory.             Outpatient Medications Prior to Visit  Medication Sig Dispense Refill   diltiazem (CARDIZEM CD) 360 MG 24 hr capsule Take 360 mg by mouth daily.     finasteride (PROSCAR) 5 MG tablet Take 5 mg by mouth daily.     furosemide (LASIX) 20 MG tablet Take 20 mg by mouth daily.     ketoconazole (NIZORAL) 2 % shampoo SMARTSIG:5 Milliliter(s) Topical Once a Week     losartan (COZAAR) 50 MG tablet Take 50 mg by mouth 2 (two) times daily.     meloxicam (MOBIC) 15 MG tablet Take 15 mg by mouth daily.     rosuvastatin (CRESTOR) 10 MG tablet Take 10 mg by mouth daily.     tamsulosin (FLOMAX) 0.4 MG CAPS capsule Take 0.4 mg by mouth every morning.     triamcinolone cream (KENALOG) 0.1 % Apply 1 application topically 2 (two) times daily.     ergocalciferol (VITAMIN D2) 1.25 MG (50000 UT) capsule TAKE 1 CAPSULES BY MOUTH MONDAY AND THURSDAY WEEKLY X 4 WEEKLY SECONDARY TO VERY LOW VITAMIN D (Patient not taking: Reported on 11/16/2023)     SYMBICORT 160-4.5 MCG/ACT inhaler Inhale into the lungs.     No  facility-administered medications prior to visit.     Past Medical History:  Diagnosis Date   Arthritis    lumbar region   Hyperlipidemia    Hypertension    Sleep apnea    Currently no treatment      Objective:     BP 129/83   Pulse 79   Temp 97.7 F (36.5 C) (Oral)   Ht 5' 7 (1.702 m) Comment: on RA  Wt 240 lb (108.9 kg)   SpO2 95% Comment: on RA  BMI 37.59 kg/m   SpO2: 95 % (on RA)  pleasant amb wm nad honking cough    HEENT : Oropharynx  clear      Nasal turbinates nl    NECK :  without  apparent JVD/ palpable Nodes/TM    LUNGS: no acc muscle use,  Nl contour chest which is clear to A and P bilaterally without cough on insp or exp maneuvers   CV:  RRR  no s3 or  murmur or increase in P2, and no edema   ABD:  soft and nontender   MS:  Gait nl   ext warm without deformities Or obvious joint restrictions  calf tenderness, cyanosis or clubbing    SKIN: warm and dry without lesions    NEURO:  alert, approp, nl sensorium with  no motor or cerebellar deficits apparent.     I personally reviewed images and agree with radiology impression as follows:   Chest CT with contrast  10/07/23  Redemonstration of linear bandlike opacity in the middle lobe, abutting the major fissure, which may represent subsegmental atelectasis. The opacity appears thickened/enlarged when compared to the prior exam from 02/02/2023. There are associated bronchiectatic changes. There is complete occlusion of approximately 1.7 cm length of the underlying segmental bronchus in the middle lobe (series 3, image 73), which previously measured up to 1.2 cm (prior exam series 11, image 79).    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        Return in 4 weeks for cxr and with all meds in hand using a trust but verify approach to confirm accurate Medication  Reconciliation The principal here is that until we are certain that  the  patients are doing what we've asked, it makes no sense to ask them to do more.   Each maintenance medication was reviewed in detail including emphasizing most importantly the difference between maintenance and prns and under what circumstances the prns are to be triggered using an action plan format where appropriate.  Total time for H and P, chart review, counseling, reviewing flutter device  device(s) and generating customized AVS  unique to this office visit / same day charting = 48 min new pt eval          AVS  Patient Instructions  Bronchiectasis =   you have scarring of your bronchial tubes which means that they don't function perfectly normally and mucus tends to pool in certain areas of your lung which can cause pneumonia and further scarring of your lung and bronchial tubes  For cough/ congestion >   mucinex dm  up to maximum of  1200 mg every 12 hours and use the flutter valve as much as you can   - ok to supplement tessalon 200 mg four times but call if this is not enough.   Pantoprazole (protonix) 40 mg   Take  30-60 min before first meal of the day and Pepcid (famotidine)  20 mg after supper until return to office - this is the best way to tell whether stomach acid is contributing to your problem.    GERD (REFLUX)  is an extremely common cause of respiratory symptoms just like yours , many times with no obvious heartburn at all.    It can be treated with medication, but also with lifestyle changes including elevation of the head of your bed (ideally with 6 -8inch blocks under the headboard of your bed),  Smoking cessation, avoidance of late meals, excessive alcohol, and avoid fatty foods, chocolate, peppermint, colas, red wine, and acidic juices such as orange juice.  NO MINT OR MENTHOL PRODUCTS SO NO COUGH DROPS  USE SUGARLESS CANDY INSTEAD (Jolley ranchers or Stover's or Life Savers) or even ice chips will also do - the key is to swallow to prevent all throat clearing. NO OIL  BASED VITAMINS - use powdered substitutes.  Avoid fish oil when coughing.   Please remember to go to the lab department   for your tests - we will call you with the results when they are available.       Please schedule a follow up office visit in 4 weeks, sooner if needed  with all medications /inhalers/ solutions in hand so we can verify exactly what you are taking. This includes all medications from all doctors and over the counters    [place], Hines 11/16/2023

## 2023-11-16 NOTE — Patient Instructions (Addendum)
 Bronchiectasis =   you have scarring of your bronchial tubes which means that they don't function perfectly normally and mucus tends to pool in certain areas of your lung which can cause pneumonia and further scarring of your lung and bronchial tubes  For cough/ congestion >   mucinex dm  up to maximum of  1200 mg every 12 hours and use the flutter valve as much as you can   - ok to supplement tessalon 200 mg four times but call if this is not enough.   Pantoprazole (protonix) 40 mg   Take  30-60 min before first meal of the day and Pepcid (famotidine)  20 mg after supper until return to office - this is the best way to tell whether stomach acid is contributing to your problem.    GERD (REFLUX)  is an extremely common cause of respiratory symptoms just like yours , many times with no obvious heartburn at all.    It can be treated with medication, but also with lifestyle changes including elevation of the head of your bed (ideally with 6 -8inch blocks under the headboard of your bed),  Smoking cessation, avoidance of late meals, excessive alcohol, and avoid fatty foods, chocolate, peppermint, colas, red wine, and acidic juices such as orange juice.  NO MINT OR MENTHOL PRODUCTS SO NO COUGH DROPS  USE SUGARLESS CANDY INSTEAD (Jolley ranchers or Stover's or Life Savers) or even ice chips will also do - the key is to swallow to prevent all throat clearing. NO OIL BASED VITAMINS - use powdered substitutes.  Avoid fish oil when coughing.   Please remember to go to the lab department   for your tests - we will call you with the results when they are available.       Please schedule a follow up office visit in 4 weeks, sooner if needed  with all medications /inhalers/ solutions in hand so we can verify exactly what you are taking. This includes all medications from all doctors and over the counters

## 2023-11-17 ENCOUNTER — Telehealth: Payer: Self-pay | Admitting: *Deleted

## 2023-11-17 ENCOUNTER — Ambulatory Visit: Payer: Self-pay | Admitting: Internal Medicine

## 2023-11-17 DIAGNOSIS — J479 Bronchiectasis, uncomplicated: Secondary | ICD-10-CM

## 2023-11-17 LAB — IGE: IgE (Immunoglobulin E), Serum: 86 kU/L (ref <=114)

## 2023-11-17 NOTE — Telephone Encounter (Signed)
 Copied from CRM [id number]. Topic: Clinical - Medical Advice >> Nov 17, 2023 10:46 AM Leila C wrote: Reason for CRM: Patient's wife Kate [phone] states patient did not received a flutter valve yesterday office visit after summary report. Kate is unsure where and how to obtain the flutter valve. Patient will be gone until 2-3 pm. Please advise and call back.

## 2023-11-17 NOTE — Telephone Encounter (Signed)
 Copied from CRM [id number]. Topic: Clinical - Medical Advice >> Nov 17, 2023 10:46 AM Leila C wrote: Reason for CRM: Patient's wife Kate [phone] states patient did not received a flutter valve yesterday office visit after summary report. Kate is unsure where and how to obtain the flutter valve. Patient will be gone until 2-3 pm. Please advise and call back.   Called and spoke with patient's wife, Kate [facility]), advised that we have the flutter valve at the office and I will put it up at the front desk for him to pick up.  One of us  will come up and show him how to use it.  Advised they can come tomorrow, 10/31, we close at 12 pm or can come next week.  Flutter valve placed up front for patient to pick up.  He will need to be shown how to use it.

## 2023-11-18 LAB — QUANTIFERON-TB GOLD PLUS
Mitogen-NIL: 9.24 [IU]/mL
NIL: 0.04 [IU]/mL
QuantiFERON-TB Gold Plus: NEGATIVE
TB1-NIL: 0 [IU]/mL
TB2-NIL: 0.01 [IU]/mL

## 2023-11-18 LAB — ALPHA-1-ANTITRYPSIN PHENOTYP: A-1 Antitrypsin: 146 mg/dL (ref 101–187)

## 2023-11-18 NOTE — Progress Notes (Signed)
 ATC x1. LVMTCB

## 2023-11-18 NOTE — Telephone Encounter (Signed)
 Patient present this am to pick up flutter valve, he was instructed on use and care of flutter valve.  He verbalized understanding.  Nothing further needed.

## 2023-11-21 NOTE — Progress Notes (Signed)
 Pt aware, verbalized understanding. Nothing farther needed.

## 2023-12-05 ENCOUNTER — Ambulatory Visit: Admitting: Podiatry

## 2023-12-05 ENCOUNTER — Encounter: Payer: Self-pay | Admitting: Podiatry

## 2023-12-05 DIAGNOSIS — M79674 Pain in right toe(s): Secondary | ICD-10-CM | POA: Diagnosis not present

## 2023-12-05 DIAGNOSIS — B351 Tinea unguium: Secondary | ICD-10-CM | POA: Diagnosis not present

## 2023-12-05 DIAGNOSIS — M79675 Pain in left toe(s): Secondary | ICD-10-CM

## 2023-12-05 NOTE — Progress Notes (Signed)
This patient returns to the office for evaluation and treatment of long thick painful nails .  This patient is unable to trim his own nails since the patient cannot reach the feet.  Patient says the nails are painful walking and wearing his shoes.  He returns for preventive foot care services.  General Appearance  Alert, conversant and in no acute stress.  Vascular  Dorsalis pedis and posterior tibial  pulses are palpable  bilaterally.  Capillary return is within normal limits  bilaterally. Temperature is within normal limits  bilaterally.  Neurologic  Senn-Weinstein monofilament wire test within normal limits  bilaterally. Muscle power within normal limits bilaterally.  Nails Thick disfigured discolored nails with subungual debris  from hallux to fifth toes bilaterally. No evidence of bacterial infection or drainage bilaterally.  Orthopedic  No limitations of motion  feet .  No crepitus or effusions noted.  No bony pathology or digital deformities noted.  Skin  normotropic skin with no porokeratosis noted bilaterally.  No signs of infections or ulcers noted.     Onychomycosis  Pain in toes right foot  Pain in toes left foot  Debridement  of nails  1-5  B/L with a nail nipper.  Nails were then filed using a dremel tool with no incidents.    RTC 3 months    Argie Lober DPM  

## 2024-01-10 ENCOUNTER — Ambulatory Visit: Admitting: Internal Medicine

## 2024-01-17 ENCOUNTER — Other Ambulatory Visit (HOSPITAL_COMMUNITY): Payer: Self-pay | Admitting: Family Medicine

## 2024-01-17 DIAGNOSIS — I272 Pulmonary hypertension, unspecified: Secondary | ICD-10-CM

## 2024-01-17 DIAGNOSIS — G4730 Sleep apnea, unspecified: Secondary | ICD-10-CM

## 2024-01-18 ENCOUNTER — Ambulatory Visit (HOSPITAL_COMMUNITY)
Admission: RE | Admit: 2024-01-18 | Discharge: 2024-01-18 | Disposition: A | Discharge status: Home / Self Care | Source: Ambulatory Visit | Attending: Family Medicine | Admitting: Family Medicine

## 2024-01-18 DIAGNOSIS — G4730 Sleep apnea, unspecified: Secondary | ICD-10-CM | POA: Insufficient documentation

## 2024-01-18 DIAGNOSIS — I272 Pulmonary hypertension, unspecified: Secondary | ICD-10-CM | POA: Insufficient documentation

## 2024-01-18 LAB — ECHOCARDIOGRAM COMPLETE
AR max vel: 2.29 cm2
AV Area VTI: 2.55 cm2
AV Area mean vel: 2.28 cm2
AV Mean grad: 7 mmHg
AV Peak grad: 12.7 mmHg
Ao pk vel: 1.78 m/s
Area-P 1/2: 3.27 cm2
S' Lateral: 2.94 cm

## 2024-01-24 ENCOUNTER — Ambulatory Visit: Admitting: Internal Medicine

## 2024-02-28 ENCOUNTER — Ambulatory Visit: Admitting: Internal Medicine

## 2024-03-06 ENCOUNTER — Ambulatory Visit: Admitting: Podiatry

## 2024-03-08 ENCOUNTER — Ambulatory Visit: Admitting: Podiatry
